# Patient Record
Sex: Female | Born: 1970 | Race: White | Hispanic: No | Marital: Married | Smoking: Never smoker
Health system: Southern US, Community
[De-identification: ages and names within clinical notes are randomized; demographics above are authoritative.]

## PROBLEM LIST (undated history)

## (undated) DIAGNOSIS — E271 Primary adrenocortical insufficiency: Secondary | ICD-10-CM

## (undated) DIAGNOSIS — K219 Gastro-esophageal reflux disease without esophagitis: Secondary | ICD-10-CM

## (undated) DIAGNOSIS — E039 Hypothyroidism, unspecified: Secondary | ICD-10-CM

## (undated) DIAGNOSIS — E079 Disorder of thyroid, unspecified: Secondary | ICD-10-CM

## (undated) HISTORY — PX: WISDOM TOOTH EXTRACTION: SHX21

## (undated) HISTORY — DX: Gastro-esophageal reflux disease without esophagitis: K21.9

---

## 1997-11-23 ENCOUNTER — Emergency Department (HOSPITAL_COMMUNITY): Admission: EM | Admit: 1997-11-23 | Discharge: 1997-11-23 | Payer: Self-pay | Admitting: Emergency Medicine

## 1998-05-07 ENCOUNTER — Other Ambulatory Visit: Admission: RE | Admit: 1998-05-07 | Discharge: 1998-05-07 | Payer: Self-pay | Admitting: Obstetrics and Gynecology

## 1999-03-30 ENCOUNTER — Other Ambulatory Visit: Admission: RE | Admit: 1999-03-30 | Discharge: 1999-03-30 | Payer: Self-pay | Admitting: Obstetrics and Gynecology

## 1999-10-09 ENCOUNTER — Encounter: Payer: Self-pay | Admitting: Obstetrics and Gynecology

## 1999-10-09 ENCOUNTER — Inpatient Hospital Stay (HOSPITAL_COMMUNITY): Admission: AD | Admit: 1999-10-09 | Discharge: 1999-10-09 | Payer: Self-pay | Admitting: Obstetrics and Gynecology

## 1999-10-22 ENCOUNTER — Inpatient Hospital Stay (HOSPITAL_COMMUNITY): Admission: AD | Admit: 1999-10-22 | Discharge: 1999-10-25 | Payer: Self-pay | Admitting: Obstetrics and Gynecology

## 1999-10-29 ENCOUNTER — Encounter: Admission: RE | Admit: 1999-10-29 | Discharge: 1999-12-01 | Payer: Self-pay | Admitting: Obstetrics and Gynecology

## 1999-11-19 ENCOUNTER — Other Ambulatory Visit: Admission: RE | Admit: 1999-11-19 | Discharge: 1999-11-19 | Payer: Self-pay | Admitting: Obstetrics and Gynecology

## 2000-06-02 ENCOUNTER — Other Ambulatory Visit: Admission: RE | Admit: 2000-06-02 | Discharge: 2000-06-02 | Payer: Self-pay | Admitting: Endocrinology

## 2000-07-13 ENCOUNTER — Encounter: Payer: Self-pay | Admitting: Obstetrics and Gynecology

## 2000-07-13 ENCOUNTER — Ambulatory Visit (HOSPITAL_COMMUNITY): Admission: RE | Admit: 2000-07-13 | Discharge: 2000-07-13 | Payer: Self-pay | Admitting: Obstetrics and Gynecology

## 2001-03-07 ENCOUNTER — Encounter: Admission: RE | Admit: 2001-03-07 | Discharge: 2001-03-07 | Payer: Self-pay | Admitting: Obstetrics and Gynecology

## 2001-03-07 ENCOUNTER — Encounter: Payer: Self-pay | Admitting: Obstetrics and Gynecology

## 2002-03-08 ENCOUNTER — Encounter: Payer: Self-pay | Admitting: Obstetrics and Gynecology

## 2002-03-08 ENCOUNTER — Encounter: Admission: RE | Admit: 2002-03-08 | Discharge: 2002-03-08 | Payer: Self-pay | Admitting: Obstetrics and Gynecology

## 2003-03-15 ENCOUNTER — Encounter: Admission: RE | Admit: 2003-03-15 | Discharge: 2003-03-15 | Payer: Self-pay | Admitting: Obstetrics and Gynecology

## 2004-03-10 ENCOUNTER — Other Ambulatory Visit: Admission: RE | Admit: 2004-03-10 | Discharge: 2004-03-10 | Payer: Self-pay | Admitting: Obstetrics and Gynecology

## 2004-04-22 ENCOUNTER — Encounter: Admission: RE | Admit: 2004-04-22 | Discharge: 2004-04-22 | Payer: Self-pay | Admitting: Obstetrics and Gynecology

## 2004-05-26 ENCOUNTER — Encounter: Admission: RE | Admit: 2004-05-26 | Discharge: 2004-05-26 | Payer: Self-pay | Admitting: Obstetrics and Gynecology

## 2004-09-01 ENCOUNTER — Ambulatory Visit: Payer: Self-pay | Admitting: Family Medicine

## 2005-04-08 ENCOUNTER — Other Ambulatory Visit: Admission: RE | Admit: 2005-04-08 | Discharge: 2005-04-08 | Payer: Self-pay | Admitting: Obstetrics and Gynecology

## 2005-07-14 ENCOUNTER — Ambulatory Visit: Payer: Self-pay | Admitting: Family Medicine

## 2005-11-09 ENCOUNTER — Encounter: Admission: RE | Admit: 2005-11-09 | Discharge: 2005-11-09 | Payer: Self-pay | Admitting: Obstetrics and Gynecology

## 2006-04-11 ENCOUNTER — Other Ambulatory Visit: Admission: RE | Admit: 2006-04-11 | Discharge: 2006-04-11 | Payer: Self-pay | Admitting: Obstetrics and Gynecology

## 2006-12-02 ENCOUNTER — Encounter: Admission: RE | Admit: 2006-12-02 | Discharge: 2006-12-02 | Payer: Self-pay | Admitting: Obstetrics and Gynecology

## 2007-04-11 ENCOUNTER — Other Ambulatory Visit: Admission: RE | Admit: 2007-04-11 | Discharge: 2007-04-11 | Payer: Self-pay | Admitting: Obstetrics and Gynecology

## 2007-11-02 ENCOUNTER — Encounter: Admission: RE | Admit: 2007-11-02 | Discharge: 2007-11-02 | Payer: Self-pay | Admitting: Obstetrics and Gynecology

## 2008-04-29 ENCOUNTER — Other Ambulatory Visit: Admission: RE | Admit: 2008-04-29 | Discharge: 2008-04-29 | Payer: Self-pay | Admitting: Obstetrics and Gynecology

## 2008-11-12 ENCOUNTER — Encounter: Admission: RE | Admit: 2008-11-12 | Discharge: 2008-11-12 | Payer: Self-pay | Admitting: Obstetrics and Gynecology

## 2008-12-30 ENCOUNTER — Encounter: Admission: RE | Admit: 2008-12-30 | Discharge: 2008-12-30 | Payer: Self-pay | Admitting: Obstetrics and Gynecology

## 2009-05-05 ENCOUNTER — Other Ambulatory Visit: Admission: RE | Admit: 2009-05-05 | Discharge: 2009-05-05 | Payer: Self-pay | Admitting: Obstetrics and Gynecology

## 2010-01-12 ENCOUNTER — Encounter: Admission: RE | Admit: 2010-01-12 | Discharge: 2010-01-12 | Payer: Self-pay | Admitting: Obstetrics and Gynecology

## 2010-01-12 ENCOUNTER — Other Ambulatory Visit: Admission: RE | Admit: 2010-01-12 | Discharge: 2010-01-12 | Payer: Self-pay | Admitting: Obstetrics and Gynecology

## 2010-02-23 ENCOUNTER — Encounter: Admission: RE | Admit: 2010-02-23 | Discharge: 2010-02-23 | Payer: Self-pay | Admitting: Orthopedic Surgery

## 2010-08-03 ENCOUNTER — Other Ambulatory Visit (HOSPITAL_COMMUNITY)
Admission: RE | Admit: 2010-08-03 | Discharge: 2010-08-03 | Disposition: A | Discharge status: Home / Self Care | Payer: 59 | Source: Ambulatory Visit | Attending: Obstetrics and Gynecology | Admitting: Obstetrics and Gynecology

## 2010-08-03 ENCOUNTER — Other Ambulatory Visit: Payer: Self-pay | Admitting: Obstetrics and Gynecology

## 2010-08-03 DIAGNOSIS — Z01419 Encounter for gynecological examination (general) (routine) without abnormal findings: Secondary | ICD-10-CM | POA: Insufficient documentation

## 2011-02-02 ENCOUNTER — Other Ambulatory Visit: Payer: Self-pay | Admitting: Obstetrics and Gynecology

## 2011-02-02 DIAGNOSIS — Z1231 Encounter for screening mammogram for malignant neoplasm of breast: Secondary | ICD-10-CM

## 2011-02-22 ENCOUNTER — Ambulatory Visit
Admission: RE | Admit: 2011-02-22 | Discharge: 2011-02-22 | Disposition: A | Discharge status: Home / Self Care | Payer: 59 | Source: Ambulatory Visit | Attending: Obstetrics and Gynecology | Admitting: Obstetrics and Gynecology

## 2011-02-22 DIAGNOSIS — Z1231 Encounter for screening mammogram for malignant neoplasm of breast: Secondary | ICD-10-CM

## 2011-08-09 ENCOUNTER — Other Ambulatory Visit: Payer: Self-pay | Admitting: Obstetrics and Gynecology

## 2011-08-09 ENCOUNTER — Other Ambulatory Visit (HOSPITAL_COMMUNITY)
Admission: RE | Admit: 2011-08-09 | Discharge: 2011-08-09 | Disposition: A | Discharge status: Home / Self Care | Payer: 59 | Source: Ambulatory Visit | Attending: Obstetrics and Gynecology | Admitting: Obstetrics and Gynecology

## 2011-08-09 DIAGNOSIS — Z01419 Encounter for gynecological examination (general) (routine) without abnormal findings: Secondary | ICD-10-CM | POA: Insufficient documentation

## 2012-02-16 ENCOUNTER — Other Ambulatory Visit: Payer: Self-pay | Admitting: Obstetrics and Gynecology

## 2012-02-16 DIAGNOSIS — Z1231 Encounter for screening mammogram for malignant neoplasm of breast: Secondary | ICD-10-CM

## 2012-03-23 ENCOUNTER — Ambulatory Visit
Admission: RE | Admit: 2012-03-23 | Discharge: 2012-03-23 | Disposition: A | Discharge status: Home / Self Care | Payer: 59 | Source: Ambulatory Visit | Attending: Obstetrics and Gynecology | Admitting: Obstetrics and Gynecology

## 2012-03-23 DIAGNOSIS — Z1231 Encounter for screening mammogram for malignant neoplasm of breast: Secondary | ICD-10-CM

## 2012-08-14 ENCOUNTER — Other Ambulatory Visit: Payer: Self-pay | Admitting: Obstetrics and Gynecology

## 2012-08-14 ENCOUNTER — Other Ambulatory Visit (HOSPITAL_COMMUNITY)
Admission: RE | Admit: 2012-08-14 | Discharge: 2012-08-14 | Disposition: A | Discharge status: Home / Self Care | Payer: 59 | Source: Ambulatory Visit | Attending: Obstetrics and Gynecology | Admitting: Obstetrics and Gynecology

## 2012-08-14 DIAGNOSIS — Z1151 Encounter for screening for human papillomavirus (HPV): Secondary | ICD-10-CM | POA: Insufficient documentation

## 2012-08-14 DIAGNOSIS — Z01419 Encounter for gynecological examination (general) (routine) without abnormal findings: Secondary | ICD-10-CM | POA: Insufficient documentation

## 2013-04-03 ENCOUNTER — Other Ambulatory Visit: Payer: Self-pay

## 2013-04-03 DIAGNOSIS — Z1231 Encounter for screening mammogram for malignant neoplasm of breast: Secondary | ICD-10-CM

## 2013-04-23 ENCOUNTER — Ambulatory Visit
Admission: RE | Admit: 2013-04-23 | Discharge: 2013-04-23 | Disposition: A | Discharge status: Home / Self Care | Payer: 59 | Source: Ambulatory Visit

## 2013-04-23 DIAGNOSIS — Z1231 Encounter for screening mammogram for malignant neoplasm of breast: Secondary | ICD-10-CM

## 2013-08-27 ENCOUNTER — Other Ambulatory Visit: Payer: Self-pay | Admitting: Obstetrics and Gynecology

## 2013-08-27 ENCOUNTER — Other Ambulatory Visit (HOSPITAL_COMMUNITY)
Admission: RE | Admit: 2013-08-27 | Discharge: 2013-08-27 | Disposition: A | Discharge status: Home / Self Care | Payer: 59 | Source: Ambulatory Visit | Attending: Obstetrics and Gynecology | Admitting: Obstetrics and Gynecology

## 2013-08-27 DIAGNOSIS — Z01419 Encounter for gynecological examination (general) (routine) without abnormal findings: Secondary | ICD-10-CM | POA: Insufficient documentation

## 2014-04-12 ENCOUNTER — Other Ambulatory Visit: Payer: Self-pay

## 2014-04-12 DIAGNOSIS — Z1231 Encounter for screening mammogram for malignant neoplasm of breast: Secondary | ICD-10-CM

## 2014-04-29 ENCOUNTER — Ambulatory Visit
Admission: RE | Admit: 2014-04-29 | Discharge: 2014-04-29 | Disposition: A | Discharge status: Home / Self Care | Payer: 59 | Source: Ambulatory Visit

## 2014-04-29 DIAGNOSIS — Z1231 Encounter for screening mammogram for malignant neoplasm of breast: Secondary | ICD-10-CM

## 2014-09-02 ENCOUNTER — Other Ambulatory Visit: Payer: Self-pay | Admitting: Obstetrics and Gynecology

## 2014-09-02 ENCOUNTER — Other Ambulatory Visit (HOSPITAL_COMMUNITY)
Admission: RE | Admit: 2014-09-02 | Discharge: 2014-09-02 | Disposition: A | Discharge status: Home / Self Care | Payer: 59 | Source: Ambulatory Visit | Attending: Obstetrics and Gynecology | Admitting: Obstetrics and Gynecology

## 2014-09-02 DIAGNOSIS — Z01419 Encounter for gynecological examination (general) (routine) without abnormal findings: Secondary | ICD-10-CM | POA: Diagnosis not present

## 2014-09-03 LAB — CYTOLOGY - PAP

## 2015-04-21 ENCOUNTER — Other Ambulatory Visit: Payer: Self-pay

## 2015-04-21 DIAGNOSIS — Z1231 Encounter for screening mammogram for malignant neoplasm of breast: Secondary | ICD-10-CM

## 2015-05-26 ENCOUNTER — Ambulatory Visit
Admission: RE | Admit: 2015-05-26 | Discharge: 2015-05-26 | Disposition: A | Discharge status: Home / Self Care | Payer: 59 | Source: Ambulatory Visit

## 2015-05-26 DIAGNOSIS — Z1231 Encounter for screening mammogram for malignant neoplasm of breast: Secondary | ICD-10-CM

## 2015-09-01 ENCOUNTER — Other Ambulatory Visit: Payer: Self-pay | Admitting: Obstetrics and Gynecology

## 2015-09-01 ENCOUNTER — Other Ambulatory Visit (HOSPITAL_COMMUNITY)
Admission: RE | Admit: 2015-09-01 | Discharge: 2015-09-01 | Disposition: A | Discharge status: Home / Self Care | Payer: 59 | Source: Ambulatory Visit | Attending: Obstetrics and Gynecology | Admitting: Obstetrics and Gynecology

## 2015-09-01 DIAGNOSIS — Z01419 Encounter for gynecological examination (general) (routine) without abnormal findings: Secondary | ICD-10-CM | POA: Insufficient documentation

## 2015-09-01 DIAGNOSIS — Z1151 Encounter for screening for human papillomavirus (HPV): Secondary | ICD-10-CM | POA: Diagnosis present

## 2015-09-05 LAB — CYTOLOGY - PAP

## 2016-04-21 DIAGNOSIS — Z0000 Encounter for general adult medical examination without abnormal findings: Secondary | ICD-10-CM | POA: Diagnosis not present

## 2016-04-28 DIAGNOSIS — Z1389 Encounter for screening for other disorder: Secondary | ICD-10-CM | POA: Diagnosis not present

## 2016-04-28 DIAGNOSIS — D751 Secondary polycythemia: Secondary | ICD-10-CM | POA: Diagnosis not present

## 2016-04-28 DIAGNOSIS — E041 Nontoxic single thyroid nodule: Secondary | ICD-10-CM | POA: Diagnosis not present

## 2016-04-28 DIAGNOSIS — E271 Primary adrenocortical insufficiency: Secondary | ICD-10-CM | POA: Diagnosis not present

## 2016-04-28 DIAGNOSIS — Z0000 Encounter for general adult medical examination without abnormal findings: Secondary | ICD-10-CM | POA: Diagnosis not present

## 2016-05-14 DIAGNOSIS — M859 Disorder of bone density and structure, unspecified: Secondary | ICD-10-CM | POA: Diagnosis not present

## 2016-05-24 ENCOUNTER — Other Ambulatory Visit: Payer: Self-pay | Admitting: Obstetrics and Gynecology

## 2016-05-24 DIAGNOSIS — Z1231 Encounter for screening mammogram for malignant neoplasm of breast: Secondary | ICD-10-CM

## 2016-06-11 ENCOUNTER — Ambulatory Visit
Admission: RE | Admit: 2016-06-11 | Discharge: 2016-06-11 | Disposition: A | Discharge status: Home / Self Care | Payer: 59 | Source: Ambulatory Visit | Attending: Obstetrics and Gynecology | Admitting: Obstetrics and Gynecology

## 2016-06-11 DIAGNOSIS — Z1231 Encounter for screening mammogram for malignant neoplasm of breast: Secondary | ICD-10-CM | POA: Diagnosis not present

## 2016-06-23 ENCOUNTER — Emergency Department (HOSPITAL_COMMUNITY)
Admission: EM | Admit: 2016-06-23 | Discharge: 2016-06-23 | Disposition: A | Discharge status: Home / Self Care | Payer: 59 | Attending: Emergency Medicine | Admitting: Emergency Medicine

## 2016-06-23 ENCOUNTER — Encounter (HOSPITAL_COMMUNITY): Payer: Self-pay | Admitting: *Deleted

## 2016-06-23 ENCOUNTER — Emergency Department (HOSPITAL_COMMUNITY): Payer: 59

## 2016-06-23 DIAGNOSIS — R05 Cough: Secondary | ICD-10-CM | POA: Diagnosis not present

## 2016-06-23 DIAGNOSIS — J111 Influenza due to unidentified influenza virus with other respiratory manifestations: Secondary | ICD-10-CM | POA: Diagnosis not present

## 2016-06-23 HISTORY — DX: Disorder of thyroid, unspecified: E07.9

## 2016-06-23 HISTORY — DX: Primary adrenocortical insufficiency: E27.1

## 2016-06-23 LAB — CBC WITH DIFFERENTIAL/PLATELET
BASOS ABS: 0 10*3/uL (ref 0.0–0.1)
Basophils Relative: 0 %
Eosinophils Absolute: 0.1 10*3/uL (ref 0.0–0.7)
Eosinophils Relative: 1 %
HEMATOCRIT: 43.5 % (ref 36.0–46.0)
Hemoglobin: 15.3 g/dL — ABNORMAL HIGH (ref 12.0–15.0)
LYMPHS PCT: 30 %
Lymphs Abs: 2 10*3/uL (ref 0.7–4.0)
MCH: 31.4 pg (ref 26.0–34.0)
MCHC: 35.2 g/dL (ref 30.0–36.0)
MCV: 89.1 fL (ref 78.0–100.0)
MONOS PCT: 13 %
Monocytes Absolute: 0.9 10*3/uL (ref 0.1–1.0)
NEUTROS ABS: 3.8 10*3/uL (ref 1.7–7.7)
Neutrophils Relative %: 56 %
PLATELETS: 172 10*3/uL (ref 150–400)
RBC: 4.88 MIL/uL (ref 3.87–5.11)
RDW: 12.9 % (ref 11.5–15.5)
WBC: 6.7 10*3/uL (ref 4.0–10.5)

## 2016-06-23 LAB — BASIC METABOLIC PANEL
ANION GAP: 12 (ref 5–15)
BUN: 10 mg/dL (ref 6–20)
CALCIUM: 8.6 mg/dL — AB (ref 8.9–10.3)
CO2: 19 mmol/L — AB (ref 22–32)
CREATININE: 0.92 mg/dL (ref 0.44–1.00)
Chloride: 99 mmol/L — ABNORMAL LOW (ref 101–111)
GFR calc Af Amer: >60 mL/min (ref >60)
GLUCOSE: 86 mg/dL (ref 65–99)
Potassium: 3.3 mmol/L — ABNORMAL LOW (ref 3.5–5.1)
Sodium: 130 mmol/L — ABNORMAL LOW (ref 135–145)

## 2016-06-23 LAB — INFLUENZA PANEL BY PCR (TYPE A & B)
Influenza A By PCR: NEGATIVE
Influenza B By PCR: POSITIVE — AB

## 2016-06-23 MED ORDER — OSELTAMIVIR PHOSPHATE 75 MG PO CAPS: 75.0000 mg | ORAL_CAPSULE | Freq: Two times a day (BID) | ORAL | 0 refills | Status: DC

## 2016-06-23 MED ORDER — FLUDROCORTISONE ACETATE 0.1 MG PO TABS
0.1000 mg | ORAL_TABLET | Freq: Every day | ORAL | Status: DC
  Administered 2016-06-23: 0.1 mg via ORAL
  Filled 2016-06-23: qty 1

## 2016-06-23 MED ORDER — SODIUM CHLORIDE 0.9 % IV BOLUS (SEPSIS)
1000.0000 mL | Freq: Once | INTRAVENOUS | Status: AC
  Administered 2016-06-23: 1000 mL via INTRAVENOUS

## 2016-06-23 MED ORDER — HYDROCORTISONE 20 MG PO TABS
20.0000 mg | ORAL_TABLET | Freq: Every day | ORAL | Status: DC
  Administered 2016-06-23: 20 mg via ORAL
  Filled 2016-06-23: qty 1

## 2016-06-23 MED ORDER — LEVOTHYROXINE SODIUM 75 MCG PO TABS
75.0000 ug | ORAL_TABLET | Freq: Once | ORAL | Status: AC
  Administered 2016-06-23: 75 ug via ORAL
  Filled 2016-06-23: qty 1

## 2016-06-23 NOTE — ED Notes (Signed)
Pt being taken to Xray

## 2016-06-23 NOTE — ED Notes (Signed)
PT returned from Xray 

## 2016-06-23 NOTE — ED Notes (Signed)
MD Zammit at the bedside.  

## 2016-06-23 NOTE — ED Notes (Signed)
Pt verbalizes understanding of discharge instructions. NAD. A/ox4.

## 2016-06-23 NOTE — Discharge Instructions (Signed)
Rest drink plenty of fluids take Motrin or Tylenol for fever and aches follow-up with any problems

## 2016-06-23 NOTE — ED Triage Notes (Signed)
Pt reports flu like symptoms since Sunday night. Has fever, bodyaches, headache, sore throat, chills.

## 2016-06-23 NOTE — ED Provider Notes (Signed)
[facility] DEPT Provider Note   CSN: [id number] Arrival date & time: 06/23/16  0903     History   Chief Complaint Chief Complaint  Patient presents with  . Influenza    HPI Mary Dunn is a 46 y.o. female.   Patient complains of cough fever aches for a couple days.   The history is provided by the patient.  Influenza  Presenting symptoms: cough   Presenting symptoms: no diarrhea, no fatigue and no headaches   Severity:  Moderate Onset quality:  Sudden Progression:  Waxing and waning Chronicity:  New Relieved by:  Nothing Associated symptoms: no congestion     Past Medical History:  Diagnosis Date  . Addison's disease ([place])   . Thyroid disease     There are no active problems to display for this patient.   History reviewed. No pertinent surgical history.  OB History    No data available       Home Medications    Prior to Admission medications   Medication Sig Start Date End Date Taking? Authorizing Provider  fludrocortisone (FLORINEF) 0.1 MG tablet Take 100 mcg by mouth daily. as directed 05/24/16  Yes Historical Provider, MD  hydrocortisone (CORTEF) 5 MG tablet Take 5-20 mg by mouth daily. 20 mg in the morning. 5 mg at bedtime   Yes Historical Provider, MD  ibuprofen (ADVIL,MOTRIN) 200 MG tablet Take 200 mg by mouth every 6 (six) hours as needed for mild pain.   Yes Historical Provider, MD  levothyroxine (SYNTHROID, LEVOTHROID) 75 MCG tablet Take 75 mcg by mouth daily. 05/24/16  Yes Historical Provider, MD  oseltamivir (TAMIFLU) 75 MG capsule Take 1 capsule (75 mg total) by mouth every 12 (twelve) hours. 06/23/16   Milton Ferguson, MD    Family History History reviewed. No pertinent family history.  Social History Social History  Substance Use Topics  . Smoking status: Never Smoker  . Smokeless tobacco: Not on file  . Alcohol use Yes     Comment: occ     Allergies   Patient has no known allergies.   Review of Systems Review of Systems   Constitutional: Negative for appetite change and fatigue.  HENT: Negative for congestion, ear discharge and sinus pressure.   Eyes: Negative for discharge.  Respiratory: Positive for cough.   Cardiovascular: Negative for chest pain.  Gastrointestinal: Negative for abdominal pain and diarrhea.  Genitourinary: Negative for frequency and hematuria.  Musculoskeletal: Negative for back pain.  Skin: Negative for rash.  Neurological: Negative for seizures and headaches.  Psychiatric/Behavioral: Negative for hallucinations.     Physical Exam Updated Vital Signs BP 96/61   Pulse 92   Temp 99.7 F (37.6 C) (Oral)   Resp 20   Ht 5\' 5"  (1.651 m)   Wt 118 lb (53.5 kg)   LMP 06/02/2016   SpO2 98%   BMI 19.64 kg/m   Physical Exam  Constitutional: She is oriented to person, place, and time. She appears well-developed.  HENT:  Head: Normocephalic.  Eyes: Conjunctivae and EOM are normal. No scleral icterus.  Neck: Neck supple. No thyromegaly present.  Cardiovascular: Normal rate and regular rhythm.  Exam reveals no gallop and no friction rub.   No murmur heard. Pulmonary/Chest: No stridor. She has no wheezes. She has no rales. She exhibits no tenderness.  Abdominal: She exhibits no distension. There is no tenderness. There is no rebound.  Musculoskeletal: Normal range of motion. She exhibits no edema.  Lymphadenopathy:    She has  no cervical adenopathy.  Neurological: She is oriented to person, place, and time. She exhibits normal muscle tone. Coordination normal.  Skin: No rash noted. No erythema.  Psychiatric: She has a normal mood and affect. Her behavior is normal.     ED Treatments / Results  Labs (all labs ordered are listed, but only abnormal results are displayed) Labs Reviewed  CBC WITH DIFFERENTIAL/PLATELET - Abnormal; Notable for the following:       Result Value   Hemoglobin 15.3 (*)    All other components within normal limits  BASIC METABOLIC PANEL - Abnormal;  Notable for the following:    Sodium 130 (*)    Potassium 3.3 (*)    Chloride 99 (*)    CO2 19 (*)    Calcium 8.6 (*)    All other components within normal limits  INFLUENZA PANEL BY PCR (TYPE A & B) - Abnormal; Notable for the following:    Influenza B By PCR POSITIVE (*)    All other components within normal limits    EKG  EKG Interpretation None       Radiology Dg Chest 2 View  Result Date: 06/23/2016 CLINICAL DATA:  Chest pressure and cough over the last 3 days. EXAM: CHEST  2 VIEW COMPARISON:  None. FINDINGS: Heart size is normal. Mediastinal shadows are normal. The lungs are clear. No bronchial thickening. No infiltrate, mass, effusion or collapse. Pulmonary vascularity is normal. No bony abnormality. Normal chest IMPRESSION: No active cardiopulmonary disease. Electronically Signed   By: Nelson Chimes M.D.   On: 06/23/2016 11:16    Procedures Procedures (including critical care time)  Medications Ordered in ED Medications  hydrocortisone (CORTEF) tablet 20 mg (20 mg Oral Given 06/23/16 1048)  fludrocortisone (FLORINEF) tablet 0.1 mg (0.1 mg Oral Given 06/23/16 1048)  sodium chloride 0.9 % bolus 1,000 mL (0 mLs Intravenous Stopped 06/23/16 1152)  levothyroxine (SYNTHROID, LEVOTHROID) tablet 75 mcg (75 mcg Oral Given 06/23/16 1047)     Initial Impression / Assessment and Plan / ED Course  I have reviewed the triage vital signs and the nursing notes.  Pertinent labs & imaging results that were available during my care of the patient were reviewed by me and considered in my medical decision making (see chart for details).     Patient with influenza.      Will treat patient with Tamiflu rest Motrin and Tylenol  Final Clinical Impressions(s) / ED Diagnoses   Final diagnoses:  Influenza    New Prescriptions New Prescriptions   OSELTAMIVIR (TAMIFLU) 75 MG CAPSULE    Take 1 capsule (75 mg total) by mouth every 12 (twelve) hours.     Milton Ferguson, MD 06/23/16  [phone]

## 2016-10-26 DIAGNOSIS — Z1389 Encounter for screening for other disorder: Secondary | ICD-10-CM | POA: Diagnosis not present

## 2016-10-26 DIAGNOSIS — E041 Nontoxic single thyroid nodule: Secondary | ICD-10-CM | POA: Diagnosis not present

## 2016-10-26 DIAGNOSIS — E271 Primary adrenocortical insufficiency: Secondary | ICD-10-CM | POA: Diagnosis not present

## 2016-10-26 DIAGNOSIS — E310 Autoimmune polyglandular failure: Secondary | ICD-10-CM | POA: Diagnosis not present

## 2016-11-12 DIAGNOSIS — Z01411 Encounter for gynecological examination (general) (routine) with abnormal findings: Secondary | ICD-10-CM | POA: Diagnosis not present

## 2016-12-28 DIAGNOSIS — N921 Excessive and frequent menstruation with irregular cycle: Secondary | ICD-10-CM | POA: Diagnosis not present

## 2017-01-08 DIAGNOSIS — Z23 Encounter for immunization: Secondary | ICD-10-CM | POA: Diagnosis not present

## 2017-01-21 ENCOUNTER — Other Ambulatory Visit: Payer: Self-pay | Admitting: Obstetrics and Gynecology

## 2017-01-21 DIAGNOSIS — N921 Excessive and frequent menstruation with irregular cycle: Secondary | ICD-10-CM | POA: Diagnosis not present

## 2017-05-03 DIAGNOSIS — M859 Disorder of bone density and structure, unspecified: Secondary | ICD-10-CM | POA: Diagnosis not present

## 2017-05-03 DIAGNOSIS — R82998 Other abnormal findings in urine: Secondary | ICD-10-CM | POA: Diagnosis not present

## 2017-05-03 DIAGNOSIS — E041 Nontoxic single thyroid nodule: Secondary | ICD-10-CM | POA: Diagnosis not present

## 2017-05-03 DIAGNOSIS — Z0000 Encounter for general adult medical examination without abnormal findings: Secondary | ICD-10-CM | POA: Diagnosis not present

## 2017-05-10 DIAGNOSIS — E038 Other specified hypothyroidism: Secondary | ICD-10-CM | POA: Diagnosis not present

## 2017-05-10 DIAGNOSIS — E310 Autoimmune polyglandular failure: Secondary | ICD-10-CM | POA: Diagnosis not present

## 2017-05-10 DIAGNOSIS — E271 Primary adrenocortical insufficiency: Secondary | ICD-10-CM | POA: Diagnosis not present

## 2017-05-10 DIAGNOSIS — Z0000 Encounter for general adult medical examination without abnormal findings: Secondary | ICD-10-CM | POA: Diagnosis not present

## 2017-05-12 DIAGNOSIS — Z1212 Encounter for screening for malignant neoplasm of rectum: Secondary | ICD-10-CM | POA: Diagnosis not present

## 2017-06-01 ENCOUNTER — Other Ambulatory Visit: Payer: Self-pay | Admitting: Obstetrics and Gynecology

## 2017-06-01 DIAGNOSIS — Z1231 Encounter for screening mammogram for malignant neoplasm of breast: Secondary | ICD-10-CM

## 2017-06-24 ENCOUNTER — Ambulatory Visit
Admission: RE | Admit: 2017-06-24 | Discharge: 2017-06-24 | Disposition: A | Discharge status: Home / Self Care | Payer: 59 | Source: Ambulatory Visit | Attending: Obstetrics and Gynecology | Admitting: Obstetrics and Gynecology

## 2017-06-24 DIAGNOSIS — Z1231 Encounter for screening mammogram for malignant neoplasm of breast: Secondary | ICD-10-CM | POA: Diagnosis not present

## 2017-11-09 DIAGNOSIS — M8580 Other specified disorders of bone density and structure, unspecified site: Secondary | ICD-10-CM | POA: Diagnosis not present

## 2017-11-09 DIAGNOSIS — E038 Other specified hypothyroidism: Secondary | ICD-10-CM | POA: Diagnosis not present

## 2017-11-09 DIAGNOSIS — E271 Primary adrenocortical insufficiency: Secondary | ICD-10-CM | POA: Diagnosis not present

## 2017-11-18 DIAGNOSIS — Z01419 Encounter for gynecological examination (general) (routine) without abnormal findings: Secondary | ICD-10-CM | POA: Diagnosis not present

## 2017-12-02 DIAGNOSIS — Z23 Encounter for immunization: Secondary | ICD-10-CM | POA: Diagnosis not present

## 2018-04-11 DIAGNOSIS — N939 Abnormal uterine and vaginal bleeding, unspecified: Secondary | ICD-10-CM | POA: Diagnosis not present

## 2018-04-25 DIAGNOSIS — N9489 Other specified conditions associated with female genital organs and menstrual cycle: Secondary | ICD-10-CM | POA: Diagnosis not present

## 2018-04-25 DIAGNOSIS — N939 Abnormal uterine and vaginal bleeding, unspecified: Secondary | ICD-10-CM | POA: Diagnosis not present

## 2018-04-25 DIAGNOSIS — Z3202 Encounter for pregnancy test, result negative: Secondary | ICD-10-CM | POA: Diagnosis not present

## 2018-05-09 ENCOUNTER — Other Ambulatory Visit: Payer: Self-pay

## 2018-05-09 ENCOUNTER — Encounter (HOSPITAL_BASED_OUTPATIENT_CLINIC_OR_DEPARTMENT_OTHER): Payer: Self-pay | Admitting: *Deleted

## 2018-05-10 DIAGNOSIS — Z0000 Encounter for general adult medical examination without abnormal findings: Secondary | ICD-10-CM | POA: Diagnosis not present

## 2018-05-10 DIAGNOSIS — R82998 Other abnormal findings in urine: Secondary | ICD-10-CM | POA: Diagnosis not present

## 2018-05-10 DIAGNOSIS — E038 Other specified hypothyroidism: Secondary | ICD-10-CM | POA: Diagnosis not present

## 2018-05-12 DIAGNOSIS — N921 Excessive and frequent menstruation with irregular cycle: Secondary | ICD-10-CM | POA: Diagnosis not present

## 2018-05-12 DIAGNOSIS — N946 Dysmenorrhea, unspecified: Secondary | ICD-10-CM | POA: Diagnosis not present

## 2018-05-12 DIAGNOSIS — N9489 Other specified conditions associated with female genital organs and menstrual cycle: Secondary | ICD-10-CM | POA: Diagnosis not present

## 2018-05-14 ENCOUNTER — Other Ambulatory Visit: Payer: Self-pay | Admitting: Obstetrics and Gynecology

## 2018-05-15 ENCOUNTER — Encounter (HOSPITAL_BASED_OUTPATIENT_CLINIC_OR_DEPARTMENT_OTHER)
Admission: RE | Admit: 2018-05-15 | Discharge: 2018-05-15 | Disposition: A | Discharge status: Home / Self Care | Payer: 59 | Source: Ambulatory Visit | Attending: Obstetrics and Gynecology | Admitting: Obstetrics and Gynecology

## 2018-05-15 DIAGNOSIS — K219 Gastro-esophageal reflux disease without esophagitis: Secondary | ICD-10-CM | POA: Diagnosis not present

## 2018-05-15 DIAGNOSIS — Z01812 Encounter for preprocedural laboratory examination: Secondary | ICD-10-CM

## 2018-05-15 DIAGNOSIS — N840 Polyp of corpus uteri: Secondary | ICD-10-CM | POA: Diagnosis not present

## 2018-05-15 DIAGNOSIS — Z79899 Other long term (current) drug therapy: Secondary | ICD-10-CM | POA: Diagnosis not present

## 2018-05-15 DIAGNOSIS — E039 Hypothyroidism, unspecified: Secondary | ICD-10-CM | POA: Diagnosis not present

## 2018-05-15 DIAGNOSIS — E271 Primary adrenocortical insufficiency: Secondary | ICD-10-CM | POA: Diagnosis not present

## 2018-05-15 DIAGNOSIS — N939 Abnormal uterine and vaginal bleeding, unspecified: Secondary | ICD-10-CM | POA: Diagnosis not present

## 2018-05-15 DIAGNOSIS — Z79890 Hormone replacement therapy (postmenopausal): Secondary | ICD-10-CM | POA: Diagnosis not present

## 2018-05-15 LAB — BASIC METABOLIC PANEL
Anion gap: 14 (ref 5–15)
BUN: 12 mg/dL (ref 6–20)
CO2: 24 mmol/L (ref 22–32)
Calcium: 9.3 mg/dL (ref 8.9–10.3)
Chloride: 97 mmol/L — ABNORMAL LOW (ref 98–111)
Creatinine, Ser: 0.92 mg/dL (ref 0.44–1.00)
GFR calc Af Amer: >60 mL/min (ref >60)
GFR calc non Af Amer: >60 mL/min (ref >60)
Glucose, Bld: 133 mg/dL — ABNORMAL HIGH (ref 70–99)
Potassium: 4.5 mmol/L (ref 3.5–5.1)
Sodium: 135 mmol/L (ref 135–145)

## 2018-05-15 LAB — CBC
HCT: 47.5 % — ABNORMAL HIGH (ref 36.0–46.0)
HEMOGLOBIN: 14.8 g/dL (ref 12.0–15.0)
MCH: 25 pg — ABNORMAL LOW (ref 26.0–34.0)
MCHC: 31.2 g/dL (ref 30.0–36.0)
MCV: 80.4 fL (ref 80.0–100.0)
Platelets: 281 10*3/uL (ref 150–400)
RBC: 5.91 MIL/uL — AB (ref 3.87–5.11)
RDW: 25 % — ABNORMAL HIGH (ref 11.5–15.5)
WBC: 7.8 10*3/uL (ref 4.0–10.5)
nRBC: 0 % (ref 0.0–0.2)

## 2018-05-15 LAB — POCT PREGNANCY, URINE: Preg Test, Ur: NEGATIVE

## 2018-05-15 NOTE — Progress Notes (Signed)
Okay'd lab results with Dr. Lanetta Inch.  Okay to proceed with surgery as scheduled.

## 2018-05-15 NOTE — Progress Notes (Signed)
Pregnancy test negative

## 2018-05-16 ENCOUNTER — Other Ambulatory Visit: Payer: Self-pay

## 2018-05-16 ENCOUNTER — Ambulatory Visit (HOSPITAL_BASED_OUTPATIENT_CLINIC_OR_DEPARTMENT_OTHER): Payer: 59 | Admitting: Certified Registered"

## 2018-05-16 ENCOUNTER — Ambulatory Visit (HOSPITAL_BASED_OUTPATIENT_CLINIC_OR_DEPARTMENT_OTHER)
Admission: RE | Admit: 2018-05-16 | Discharge: 2018-05-16 | Disposition: A | Discharge status: Home / Self Care | Payer: 59 | Attending: Obstetrics and Gynecology | Admitting: Obstetrics and Gynecology

## 2018-05-16 ENCOUNTER — Encounter (HOSPITAL_BASED_OUTPATIENT_CLINIC_OR_DEPARTMENT_OTHER)
Admission: RE | Disposition: A | Discharge status: Home / Self Care | Payer: Self-pay | Source: Home / Self Care | Attending: Obstetrics and Gynecology

## 2018-05-16 ENCOUNTER — Encounter (HOSPITAL_BASED_OUTPATIENT_CLINIC_OR_DEPARTMENT_OTHER): Payer: Self-pay

## 2018-05-16 DIAGNOSIS — N840 Polyp of corpus uteri: Secondary | ICD-10-CM | POA: Diagnosis not present

## 2018-05-16 DIAGNOSIS — E039 Hypothyroidism, unspecified: Secondary | ICD-10-CM | POA: Insufficient documentation

## 2018-05-16 DIAGNOSIS — N858 Other specified noninflammatory disorders of uterus: Secondary | ICD-10-CM | POA: Diagnosis not present

## 2018-05-16 DIAGNOSIS — E271 Primary adrenocortical insufficiency: Secondary | ICD-10-CM | POA: Insufficient documentation

## 2018-05-16 DIAGNOSIS — N9489 Other specified conditions associated with female genital organs and menstrual cycle: Secondary | ICD-10-CM | POA: Diagnosis not present

## 2018-05-16 DIAGNOSIS — K219 Gastro-esophageal reflux disease without esophagitis: Secondary | ICD-10-CM | POA: Insufficient documentation

## 2018-05-16 DIAGNOSIS — Z79899 Other long term (current) drug therapy: Secondary | ICD-10-CM | POA: Insufficient documentation

## 2018-05-16 DIAGNOSIS — N939 Abnormal uterine and vaginal bleeding, unspecified: Secondary | ICD-10-CM | POA: Diagnosis not present

## 2018-05-16 DIAGNOSIS — Z79890 Hormone replacement therapy (postmenopausal): Secondary | ICD-10-CM | POA: Insufficient documentation

## 2018-05-16 HISTORY — DX: Hypothyroidism, unspecified: E03.9

## 2018-05-16 HISTORY — PX: DILATATION & CURETTAGE/HYSTEROSCOPY WITH MYOSURE: SHX6511

## 2018-05-16 SURGERY — DILATATION & CURETTAGE/HYSTEROSCOPY WITH MYOSURE
Anesthesia: General | Site: Uterus

## 2018-05-16 MED ORDER — LIDOCAINE 2% (20 MG/ML) 5 ML SYRINGE
INTRAMUSCULAR | Status: DC | PRN
  Administered 2018-05-16: 60 mg via INTRAVENOUS

## 2018-05-16 MED ORDER — FENTANYL CITRATE (PF) 100 MCG/2ML IJ SOLN: 25.0000 ug | INTRAMUSCULAR | Status: DC | PRN

## 2018-05-16 MED ORDER — SILVER NITRATE-POT NITRATE 75-25 % EX MISC
CUTANEOUS | Status: DC | PRN
  Administered 2018-05-16: 1

## 2018-05-16 MED ORDER — ONDANSETRON HCL 4 MG/2ML IJ SOLN
INTRAMUSCULAR | Status: DC | PRN
  Administered 2018-05-16: 4 mg via INTRAVENOUS

## 2018-05-16 MED ORDER — ACETAMINOPHEN 500 MG PO TABS
1000.0000 mg | ORAL_TABLET | ORAL | Status: AC
  Administered 2018-05-16: 1000 mg via ORAL

## 2018-05-16 MED ORDER — MIDAZOLAM HCL 2 MG/2ML IJ SOLN
INTRAMUSCULAR | Status: AC
  Filled 2018-05-16: qty 2

## 2018-05-16 MED ORDER — LACTATED RINGERS IV SOLN
INTRAVENOUS | Status: DC
  Administered 2018-05-16: 12:00:00 via INTRAVENOUS

## 2018-05-16 MED ORDER — OXYCODONE HCL 5 MG/5ML PO SOLN: 5.0000 mg | Freq: Once | ORAL | Status: DC | PRN

## 2018-05-16 MED ORDER — ACETAMINOPHEN 10 MG/ML IV SOLN: 1000.0000 mg | Freq: Once | INTRAVENOUS | Status: DC | PRN

## 2018-05-16 MED ORDER — BUPIVACAINE HCL (PF) 0.25 % IJ SOLN
INTRAMUSCULAR | Status: DC | PRN
  Administered 2018-05-16: 20 mL

## 2018-05-16 MED ORDER — ACETAMINOPHEN 500 MG PO TABS
ORAL_TABLET | ORAL | Status: AC
  Filled 2018-05-16: qty 2

## 2018-05-16 MED ORDER — MIDAZOLAM HCL 2 MG/2ML IJ SOLN
1.0000 mg | INTRAMUSCULAR | Status: DC | PRN
  Administered 2018-05-16: 2 mg via INTRAVENOUS

## 2018-05-16 MED ORDER — LACTATED RINGERS IV SOLN
INTRAVENOUS | Status: DC
  Administered 2018-05-16: 11:00:00 via INTRAVENOUS

## 2018-05-16 MED ORDER — SODIUM CHLORIDE 0.9 % IR SOLN
Status: DC | PRN
  Administered 2018-05-16: 3000 mL

## 2018-05-16 MED ORDER — DOXYCYCLINE HYCLATE 50 MG PO CAPS: 100.0000 mg | ORAL_CAPSULE | Freq: Every day | ORAL | 0 refills | Status: AC

## 2018-05-16 MED ORDER — PROPOFOL 10 MG/ML IV BOLUS
INTRAVENOUS | Status: DC | PRN
  Administered 2018-05-16: 130 mg via INTRAVENOUS

## 2018-05-16 MED ORDER — IBUPROFEN 800 MG PO TABS: 800.0000 mg | ORAL_TABLET | Freq: Three times a day (TID) | ORAL | 0 refills | Status: AC | PRN

## 2018-05-16 MED ORDER — ONDANSETRON HCL 4 MG/2ML IJ SOLN
INTRAMUSCULAR | Status: AC
  Filled 2018-05-16: qty 2

## 2018-05-16 MED ORDER — SCOPOLAMINE 1 MG/3DAYS TD PT72: 1.0000 | MEDICATED_PATCH | Freq: Once | TRANSDERMAL | Status: DC | PRN

## 2018-05-16 MED ORDER — OXYCODONE HCL 5 MG PO TABS: 5.0000 mg | ORAL_TABLET | Freq: Four times a day (QID) | ORAL | 0 refills | Status: DC | PRN

## 2018-05-16 MED ORDER — SILVER NITRATE-POT NITRATE 75-25 % EX MISC
CUTANEOUS | Status: AC
  Filled 2018-05-16: qty 1

## 2018-05-16 MED ORDER — FENTANYL CITRATE (PF) 100 MCG/2ML IJ SOLN
50.0000 ug | INTRAMUSCULAR | Status: AC | PRN
  Administered 2018-05-16: 25 ug via INTRAVENOUS
  Administered 2018-05-16: 50 ug via INTRAVENOUS
  Administered 2018-05-16: 25 ug via INTRAVENOUS

## 2018-05-16 MED ORDER — DEXAMETHASONE SODIUM PHOSPHATE 4 MG/ML IJ SOLN
INTRAMUSCULAR | Status: DC | PRN
  Administered 2018-05-16: 10 mg via INTRAVENOUS

## 2018-05-16 MED ORDER — OXYCODONE HCL 5 MG PO TABS: 5.0000 mg | ORAL_TABLET | Freq: Once | ORAL | Status: DC | PRN

## 2018-05-16 MED ORDER — BUPIVACAINE HCL (PF) 0.25 % IJ SOLN
INTRAMUSCULAR | Status: AC
  Filled 2018-05-16: qty 30

## 2018-05-16 MED ORDER — PROMETHAZINE HCL 25 MG/ML IJ SOLN: 6.2500 mg | INTRAMUSCULAR | Status: DC | PRN

## 2018-05-16 MED ORDER — FENTANYL CITRATE (PF) 100 MCG/2ML IJ SOLN
INTRAMUSCULAR | Status: AC
  Filled 2018-05-16: qty 2

## 2018-05-16 SURGICAL SUPPLY — 16 items
CANISTER SUCT 3000ML PPV (MISCELLANEOUS) ×3 IMPLANT
CATH ROBINSON RED A/P 16FR (CATHETERS) ×1 IMPLANT
DEVICE MYOSURE LITE (MISCELLANEOUS) ×2 IMPLANT
DEVICE MYOSURE REACH (MISCELLANEOUS) IMPLANT
FILTER ARTHROSCOPY CONVERTOR (FILTER) ×1 IMPLANT
GLOVE BIOGEL M 6.5 STRL (GLOVE) ×3 IMPLANT
GLOVE BIOGEL PI IND STRL 6.5 (GLOVE) ×1 IMPLANT
GLOVE BIOGEL PI IND STRL 7.0 (GLOVE) ×1 IMPLANT
GLOVE BIOGEL PI INDICATOR 6.5 (GLOVE) ×2
GLOVE BIOGEL PI INDICATOR 7.0 (GLOVE) ×2
GOWN STRL REUS W/TWL LRG LVL3 (GOWN DISPOSABLE) ×6 IMPLANT
KIT PROCEDURE FLUENT (KITS) ×3 IMPLANT
PACK VAGINAL MINOR WOMEN LF (CUSTOM PROCEDURE TRAY) ×3 IMPLANT
PAD OB MATERNITY 4.3X12.25 (PERSONAL CARE ITEMS) ×3 IMPLANT
SEAL ROD LENS SCOPE MYOSURE (ABLATOR) ×3 IMPLANT
TOWEL GREEN STERILE FF (TOWEL DISPOSABLE) ×4 IMPLANT

## 2018-05-16 NOTE — Op Note (Signed)
05/16/2018  1:13 PM  PATIENT:  Mary Dunn  48 y.o. female  PRE-OPERATIVE DIAGNOSIS:  N93.9 abnormal uterine bleeding N94.89 endometrial mass  POST-OPERATIVE DIAGNOSIS:  N93.9 abnormal uterine bleeding N94.89 endometrial mass  PROCEDURE:  Procedure(s): DILATATION AND CURETTAGE /HYSTEROSCOPY WITH MYOSURE (N/A)  SURGEON:  Surgeon(s) and Role:    Christophe Louis, MD - Primary  PHYSICIAN ASSISTANT: None  ASSISTANTS: none   ANESTHESIA:   general  EBL:  10 mL   BLOOD ADMINISTERED:none  DRAINS: none   LOCAL MEDICATIONS USED:  MARCAINE     SPECIMEN:  Source of Specimen:  endometrial curretting and endometrial polyp   DISPOSITION OF SPECIMEN:  PATHOLOGY  COUNTS:  YES  TOURNIQUET:  * No tourniquets in log *  DICTATION: .Dragon Dictation  PLAN OF CARE: Discharge to home after PACU  PATIENT DISPOSITION:  PACU - hemodynamically stable.   Delay start of Pharmacological VTE agent (>24hrs) due to surgical blood loss or risk of bleeding: not applicable  Finding: Polypoid proliferative endometrium . Normal cervix and external genitalia   Procedure: Patient was taken to the operating room where she was placed under general anesthesia. She was placed in the dorsal lithotomy position. She was prepped and draped in the usual sterile fashion. A speculum was placed into the vaginal vault. The anterior lip of the cervix was grasped with a single-tooth tenaculum. Quarter percent Marcaine was injected at the 4 and 8:00 positions of the cervix. The cervix was then sounded to 8 cm. The cervix was dilated to approximately 6 mm. Mysosure operative  hysteroscope was inserted. The findings noted above. Myosure lite  blade was introduced throught the hysteroscope.  Endometrial currettings were obtained. there was no evidence of perforation. Hysteroscope was then removed.  The single-tooth tenaculum was removed from the anterior lip of the cervix. Patient was noted to have bleeding from the tenaculum  site. Silver nitrate was applied and excellent hemostasis was noted. The speculum was removed from the patient's vagina. She was awakened from anesthesia taken care  To the recovery  room awake and in stable condition. Sponge lap and needle counts were correct x2.   Saline deficit 300 mL

## 2018-05-16 NOTE — Discharge Instructions (Signed)

## 2018-05-16 NOTE — Anesthesia Postprocedure Evaluation (Signed)
Anesthesia Post Note  Patient: Mary Dunn  Procedure(s) Performed: DILATATION AND CURETTAGE /HYSTEROSCOPY WITH MYOSURE (N/A Uterus)     Patient location during evaluation: PACU Anesthesia Type: General Level of consciousness: awake and alert Pain management: pain level controlled Vital Signs Assessment: post-procedure vital signs reviewed and stable Respiratory status: spontaneous breathing, nonlabored ventilation and respiratory function stable Cardiovascular status: blood pressure returned to baseline and stable Postop Assessment: no apparent nausea or vomiting Anesthetic complications: no    Last Vitals:  Vitals:   05/16/18 1330 05/16/18 1345  BP: 133/78 (!) 143/74  Pulse: 74 73  Resp: 11 11  Temp:    SpO2: 100% 100%    Last Pain:  Vitals:   05/16/18 1356  TempSrc:   PainSc: 0-No pain                 Brennan Bailey

## 2018-05-16 NOTE — Transfer of Care (Signed)
Immediate Anesthesia Transfer of Care Note  Patient: Mary Dunn  Procedure(s) Performed: DILATATION AND CURETTAGE /HYSTEROSCOPY WITH MYOSURE (N/A Uterus)  Patient Location: PACU  Anesthesia Type:General  Level of Consciousness: sedated  Airway & Oxygen Therapy: Patient Spontanous Breathing and Patient connected to face mask oxygen  Post-op Assessment: Report given to RN and Post -op Vital signs reviewed and stable  Post vital signs: Reviewed and stable  Last Vitals:  Vitals Value Taken Time  BP 125/70 05/16/2018  1:16 PM  Temp    Pulse 75 05/16/2018  1:21 PM  Resp 13 05/16/2018  1:21 PM  SpO2 100 % 05/16/2018  1:21 PM  Vitals shown include unvalidated device data.  Last Pain:  Vitals:   05/16/18 1116  TempSrc: Oral  PainSc: 0-No pain         Complications: No apparent anesthesia complications

## 2018-05-16 NOTE — Anesthesia Preprocedure Evaluation (Addendum)
Anesthesia Evaluation  Patient identified by MRN, date of birth, ID band Patient awake    Reviewed: Allergy & Precautions, NPO status , Patient's Chart, lab work & pertinent test results  History of Anesthesia Complications Negative for: history of anesthetic complications  Airway Mallampati: II  TM Distance: >3 FB Neck ROM: Full    Dental  (+) Teeth Intact, Dental Advisory Given   Pulmonary neg pulmonary ROS,    Pulmonary exam normal breath sounds clear to auscultation       Cardiovascular negative cardio ROS Normal cardiovascular exam Rhythm:Regular Rate:Normal     Neuro/Psych negative neurological ROS     GI/Hepatic Neg liver ROS, GERD  Medicated and Controlled,  Endo/Other  Hypothyroidism Addison's disease  Renal/GU negative Renal ROS     Musculoskeletal negative musculoskeletal ROS (+)   Abdominal   Peds  Hematology negative hematology ROS (+)   Anesthesia Other Findings Day of surgery medications reviewed with the patient.  Reproductive/Obstetrics Endometrial mass                            Anesthesia Physical Anesthesia Plan  ASA: II  Anesthesia Plan: General   Post-op Pain Management:    Induction: Intravenous  PONV Risk Score and Plan: 3 and Treatment may vary due to age or medical condition, Ondansetron, Dexamethasone and Midazolam  Airway Management Planned: LMA  Additional Equipment:   Intra-op Plan:   Post-operative Plan: Extubation in OR  Informed Consent: I have reviewed the patients History and Physical, chart, labs and discussed the procedure including the risks, benefits and alternatives for the proposed anesthesia with the patient or authorized representative who has indicated his/her understanding and acceptance.     Dental advisory given  Plan Discussed with: CRNA  Anesthesia Plan Comments:        Anesthesia Quick Evaluation

## 2018-05-16 NOTE — Anesthesia Procedure Notes (Signed)
Procedure Name: LMA Insertion Date/Time: 05/16/2018 12:37 PM Performed by: Maryella Shivers, CRNA Pre-anesthesia Checklist: Patient identified, Emergency Drugs available, Suction available and Patient being monitored Patient Re-evaluated:Patient Re-evaluated prior to induction Oxygen Delivery Method: Circle system utilized Preoxygenation: Pre-oxygenation with 100% oxygen Induction Type: IV induction Ventilation: Mask ventilation without difficulty LMA: LMA inserted LMA Size: 4.0 Number of attempts: 1 Airway Equipment and Method: Bite block Placement Confirmation: positive ETCO2 Tube secured with: Tape Dental Injury: Teeth and Oropharynx as per pre-operative assessment

## 2018-05-16 NOTE — H&P (Signed)
Date of Initial H&P: 05/16/2018  History reviewed, patient examined, no change in status, stable for surgery.

## 2018-05-16 NOTE — H&P (Signed)
Reason for Appointment  1. Aub   History of Present Illness  General:  48 y/o presents to discuss u/s results to evaluate abnormal uterine bleeding She was last seen in office on 04/11/2018 at which time she was complaining of bright red bleeding which occurred on 04/03/2018 and 04/05/2018. On the 6th she noticed bleeding after exercising. On the 8th she experienced bleeding after sexual intercourse. Her LMP was 03/17/2018.  She had an u/s today which revealed her uterus measuring at 7.9 x 5.1 x 4.6cm. Her endometrium was thickened at 1.46cm with questionable hypocholeric mass at 1.4cm, no blood flow was noted. Right ovary revealed a simple follicle. Left ovary was normal.  Discussed with patient further treatment options being a hysteroscopy D&C.    Current Medications  Taking   Calcium 1500 MG Tablet 1 tablet Orally Twice a day   Fludrocortisone Acetate .05 MG Tablet 1 tablet Orally Once a day   Hydrocortisone 20 MG Tablet 1 tablet Orally Once a day   Levothyroxine Sodium 75 MCG Tablet 1 tablet Orally once a day   Multivitamins Tablet 1 tablet Orally once a day   nexium 1 tab Oral   Medication List reviewed and reconciled with the patient    Past Medical History  Addison's disease.   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   Hypothyroidism.    Surgical History  cesarean section x 1    Family History  Father: deceased, hypertension, thyroid disease  Mother: deceased 52 yrs, hypertension, cancer, breast dx at age 54, diagnosed with Breast cancer  Maternal Grand Mother: cancer, ovarian, dx at age 75 or 1  Sister 1: alive, thyroid disease  1 sister(s) . 2 son(s) , 1 daughter(s) .    Social History  General:  no EXPOSURE TO PASSIVE SMOKE. Alcohol: yes, 1 per week. Children: Boys, 2, girls, 1. Caffeine: yes, coffee, soda, very little, tea, occasionally. Seat belt use: yes. Tobacco use cigarettes: Never smoked, Tobacco history last updated  04/25/2018. Marital Status: married. no Recreational drug use. OCCUPATION: employed, [employer]. Exercise: yes, 3 times weekly.    Gyn History  Sexual activity currently sexually active.  Periods : irregular.  LMP 03/17/2018.  Birth control vasectomy.  Last pap smear date 09/01/2015-neg.  Last mammogram date 06/24/17-normal .  Denies H/O Abnormal pap smear none.  Denies H/O STD.  Menarche 25.  GYN procedures Colposcopy 06/30/09.  Denies H/O Genetic history.  Denies H/O Trying to get pregnant.    OB History  Number of pregnancies 4, 1 SAB.  Pregnancy # 1 live birth, boy, C-section delivery.  Pregnancy # 2 live birth, boy, vaginal .  Pregnancy # 3 live birth, girl, vaginal delivery,.    Allergies  N.K.D.A.   Hospitalization/Major Diagnostic Procedure  childbirth x 3    Review of Systems  CONSTITUTIONAL:  Chills No. Fatigue No. Fever No. Night sweats No. Recent travel outside [place] No. Sweats No. Weight change No.  OPHTHALMOLOGY:  Blurring of vision no. Change in vision no. Double vision no.  ENT:  Dizziness no. Nose bleeds no. Sore throat no. Teeth pain no.  ALLERGY:  Hives no.  CARDIOLOGY:  Chest pain no. High blood pressure no. Irregular heart beat no. Leg edema no. Palpitations no.  RESPIRATORY:  Shortness of breath no. Cough no. Wheezing no.  UROLOGY:  Pain with urination no. Urinary urgency no. Urinary frequency no. Urinary incontinence no. Difficulty urinating No. Blood in urine No.  GASTROENTEROLOGY:  Abdominal pain  no. Appetite change no. Bloating/belching no. Blood in stool or on toilet paper no. Change in bowel movements no. Constipation no. Diarrhea no. Difficulty swallowing no. Nausea no.  FEMALE REPRODUCTIVE:  Vulvar pain no. Vulvar rash no. Abnormal vaginal bleeding yes. Breast pain no. Nipple discharge no. Pain with intercourse no. Pelvic pain no. Unusual vaginal discharge no. Vaginal itching no.  MUSCULOSKELETAL:  Muscle aches no.  NEUROLOGY:  Headache  no. Tingling/numbness no. Weakness no.  PSYCHOLOGY:  Depression no. Anxiety no. Nervousness no. Sleep disturbances no. Suicidal ideation no .  ENDOCRINOLOGY:  Excessive thirst no. Excessive urination no. Hair loss no. Heat or cold intolerance no.  HEMATOLOGY/LYMPH:  Abnormal bleeding no. Easy bruising no. Swollen glands no.  DERMATOLOGY:  New/changing skin lesion no. Rash no. Sores no.       Vital Signs  Wt 126, Wt change 1 lb, Ht 64.25, BMI 21.46, Pulse sitting 78, BP sitting 142/70.   Examination  General Examination: CONSTITUTIONAL: alert, oriented, NAD. SKIN: moist, warm. EYES: Conjunctiva clear. LUNGS: clear to auscultation bilaterally. HEART: regular rate and rhythm. ABDOMEN: no guarding. EXTREMITIES: normal range of motion. NEUROLOGIC EXAM: alert and oriented x 3. PSYCH: affect normal.     Physical Examination  Pt aware of scribe services today.   Assessments   1. Abnormal uterine bleeding - N93.9 (Primary)   2. Pregnancy test negative - Z32.02   3. Endometrial mass - N94.89   Treatment  1. Abnormal uterine bleeding  IMAGING: US ECHO TRANSVAGINAL Notes: most likely secondary to endometrial polyp.. d/w pt hysteroscopy D&C and polypectomy r/b/a discussed including but not limited to infection bleeding, uterine perforation with the need for futher surgery. pt voiced understanding and wishes to proceed plan surgical clearance from Endocrinologist Dr. Roque Cash with [facility].  Referral To: Reason:Please check insurance coverage of hysteroscopy D&C and polypectomy     2. Pregnancy test negative  LAB: HCG, Urine (pregnancy)   3. Endometrial mass  Notes: Discussed with pt. proceeding with a hysteroscopy D&C. Discussed risk of surgery with patient including but not limited to infection/bleeding, uterine perforation with the need for further surgery. Pt. wishes to proceed with surgery. Pt. is aware that she will be out of work for day of surgery and day  afterwards. No sexual activity for two weeks. Will send a surgical clearance to Dr. Roque Cash due to her history of Addison's disease before proceeding with procedure. Pt. aware no eating or drinking night prior to surgery. Pt. aware to avoid taking Aleve, Ibuprofen, Motrin. She can take Tylenol from now up until surgery. Marland Kitchen

## 2018-05-17 ENCOUNTER — Encounter (HOSPITAL_BASED_OUTPATIENT_CLINIC_OR_DEPARTMENT_OTHER): Payer: Self-pay | Admitting: Obstetrics and Gynecology

## 2018-05-17 DIAGNOSIS — E310 Autoimmune polyglandular failure: Secondary | ICD-10-CM | POA: Diagnosis not present

## 2018-05-17 DIAGNOSIS — Z0000 Encounter for general adult medical examination without abnormal findings: Secondary | ICD-10-CM | POA: Diagnosis not present

## 2018-05-17 DIAGNOSIS — E271 Primary adrenocortical insufficiency: Secondary | ICD-10-CM | POA: Diagnosis not present

## 2018-05-17 DIAGNOSIS — E038 Other specified hypothyroidism: Secondary | ICD-10-CM | POA: Diagnosis not present

## 2018-06-06 ENCOUNTER — Other Ambulatory Visit: Payer: Self-pay | Admitting: Obstetrics and Gynecology

## 2018-06-06 DIAGNOSIS — Z1231 Encounter for screening mammogram for malignant neoplasm of breast: Secondary | ICD-10-CM

## 2018-06-30 ENCOUNTER — Ambulatory Visit: Payer: 59

## 2018-09-08 ENCOUNTER — Other Ambulatory Visit: Payer: Self-pay

## 2018-09-08 ENCOUNTER — Ambulatory Visit
Admission: RE | Admit: 2018-09-08 | Discharge: 2018-09-08 | Disposition: A | Discharge status: Home / Self Care | Payer: 59 | Source: Ambulatory Visit | Attending: Obstetrics and Gynecology | Admitting: Obstetrics and Gynecology

## 2018-09-08 DIAGNOSIS — Z1231 Encounter for screening mammogram for malignant neoplasm of breast: Secondary | ICD-10-CM

## 2018-12-06 ENCOUNTER — Encounter: Payer: Self-pay | Admitting: Internal Medicine

## 2018-12-29 ENCOUNTER — Other Ambulatory Visit: Payer: Self-pay | Admitting: *Deleted

## 2019-01-15 ENCOUNTER — Telehealth: Payer: Self-pay | Admitting: *Deleted

## 2019-01-15 ENCOUNTER — Ambulatory Visit (AMBULATORY_SURGERY_CENTER): Payer: Self-pay | Admitting: *Deleted

## 2019-01-15 ENCOUNTER — Other Ambulatory Visit: Payer: Self-pay

## 2019-01-15 VITALS — Temp 97.5°F | Ht 65.0 in | Wt 126.8 lb

## 2019-01-15 DIAGNOSIS — Z1211 Encounter for screening for malignant neoplasm of colon: Secondary | ICD-10-CM

## 2019-01-15 MED ORDER — SUPREP BOWEL PREP KIT 17.5-3.13-1.6 GM/177ML PO SOLN: 1.0000 | Freq: Once | ORAL | 0 refills | Status: AC

## 2019-01-15 NOTE — Telephone Encounter (Signed)
Nothing, but thanks for checking

## 2019-01-15 NOTE — Progress Notes (Signed)
No egg or soy allergy known to patient  No issues with past sedation with any surgeries  or procedures, no intubation problems  No diet pills per patient No home 02 use per patient  No blood thinners per patient  Pt denies issues with constipation  No A fib or A flutter  EMMI video sent to pt's e mail   Due to the COVID-19 pandemic we are asking patients to follow these guidelines. Please only bring one care partner. Please be aware that your care partner may wait in the car in the parking lot or if they feel like they will be too hot to wait in the car, they may wait in the lobby on the 4th floor. All care partners are required to wear a mask the entire time (we do not have any that we can provide them), they need to practice social distancing, and we will do a Covid check for all patient's and care partners when you arrive. Also we will check their temperature and your temperature. If the care partner waits in their car they need to stay in the parking lot the entire time and we will call them on their cell phone when the patient is ready for discharge so they can bring the car to the front of the building. Also all patient's will need to wear a mask into building.  Suprep $15 coupon  Pt declined COVID screen for 10/30 colon   Pt has Addison's dx and will contact her MD about possibly increasing Steroids for her colonoscopy-

## 2019-01-15 NOTE — Telephone Encounter (Signed)
Dr Henrene Pastor,  I saw this pt today in [facility]- she has a diagnosis of Addison's Disease- She told me she will contact her MD about possible increasing her steroids for her colonoscopy but is there any thing on our end we need to do or provide for her procedure ?  Please advise, Thanks, Marijean Niemann

## 2019-01-16 ENCOUNTER — Encounter: Payer: Self-pay | Admitting: Internal Medicine

## 2019-01-19 ENCOUNTER — Other Ambulatory Visit (HOSPITAL_COMMUNITY)
Admission: RE | Admit: 2019-01-19 | Discharge: 2019-01-19 | Disposition: A | Discharge status: Home / Self Care | Payer: 59 | Source: Ambulatory Visit | Attending: Obstetrics and Gynecology | Admitting: Obstetrics and Gynecology

## 2019-01-19 DIAGNOSIS — Z01419 Encounter for gynecological examination (general) (routine) without abnormal findings: Secondary | ICD-10-CM | POA: Insufficient documentation

## 2019-01-23 LAB — CYTOLOGY - PAP
Comment: NEGATIVE
Diagnosis: NEGATIVE
High risk HPV: NEGATIVE

## 2019-01-26 ENCOUNTER — Other Ambulatory Visit: Payer: Self-pay

## 2019-01-26 ENCOUNTER — Encounter: Payer: Self-pay | Admitting: Internal Medicine

## 2019-01-26 ENCOUNTER — Ambulatory Visit (AMBULATORY_SURGERY_CENTER): Payer: 59 | Admitting: Internal Medicine

## 2019-01-26 ENCOUNTER — Other Ambulatory Visit: Payer: Self-pay | Admitting: Internal Medicine

## 2019-01-26 VITALS — BP 94/74 | HR 72 | Temp 98.7°F | Resp 9 | Ht 65.0 in | Wt 126.0 lb

## 2019-01-26 DIAGNOSIS — Z1211 Encounter for screening for malignant neoplasm of colon: Secondary | ICD-10-CM

## 2019-01-26 DIAGNOSIS — D124 Benign neoplasm of descending colon: Secondary | ICD-10-CM | POA: Diagnosis not present

## 2019-01-26 MED ORDER — SODIUM CHLORIDE 0.9 % IV SOLN: 500.0000 mL | Freq: Once | INTRAVENOUS | Status: DC

## 2019-01-26 NOTE — Progress Notes (Signed)
Called to room to assist during endoscopic procedure.  Patient ID and intended procedure confirmed with present staff. Received instructions for my participation in the procedure from the performing physician.  

## 2019-01-26 NOTE — Progress Notes (Signed)
A and O x3. Report to RN. Tolerated MAC anesthesia well.

## 2019-01-26 NOTE — Op Note (Signed)
[facility] Patient Name: Mary Dunn Procedure Date: 01/26/2019 1:16 PM MRN: [id number] Endoscopist: Docia Chuck. Henrene Pastor , MD Age: 48 Referring MD:  Date of Birth: 1970/07/03 Gender: Female Account #: [id number] Procedure:                Colonoscopy with cold snaare polypectomy x 1 Indications:              Screening for colorectal malignant neoplasm Medicines:                Monitored Anesthesia Care Procedure:                Pre-Anesthesia Assessment:                           - Prior to the procedure, a History and Physical                            was performed, and patient medications and                            allergies were reviewed. The patient's tolerance of                            previous anesthesia was also reviewed. The risks                            and benefits of the procedure and the sedation                            options and risks were discussed with the patient.                            All questions were answered, and informed consent                            was obtained. Prior Anticoagulants: The patient has                            taken no previous anticoagulant or antiplatelet                            agents. ASA Grade Assessment: II - A patient with                            mild systemic disease. After reviewing the risks                            and benefits, the patient was deemed in                            satisfactory condition to undergo the procedure.                           After obtaining informed consent, the colonoscope  was passed under direct vision. Throughout the                            procedure, the patient's blood pressure, pulse, and                            oxygen saturations were monitored continuously. The                            Colonoscope was introduced through the anus and                            advanced to the the cecum, identified by         appendiceal orifice and ileocecal valve. The                            ileocecal valve, appendiceal orifice, and rectum                            were photographed. The quality of the bowel                            preparation was excellent. The colonoscopy was                            performed without difficulty. The patient tolerated                            the procedure well. The bowel preparation used was                            SUPREP via split dose instruction. Scope In: 1:32:26 PM Scope Out: 1:47:05 PM Scope Withdrawal Time: 0 hours 10 minutes 50 seconds  Total Procedure Duration: 0 hours 14 minutes 39 seconds  Findings:                 A 5 mm polyp was found in the descending colon. The                            polyp was removed with a cold snare. Resection and                            retrieval were complete.                           The exam was otherwise without abnormality on                            direct and retroflexion views. Complications:            No immediate complications. Estimated blood loss:                            None. Estimated Blood Loss:     Estimated blood loss: none. Impression:               -  One 5 mm polyp in the descending colon, removed                            with a cold snare. Resected and retrieved.                           - The examination was otherwise normal on direct                            and retroflexion views. Recommendation:           - Repeat colonoscopy in 7-10 years for surveillance.                           - Patient has a contact number available for                            emergencies. The signs and symptoms of potential                            delayed complications were discussed with the                            patient. Return to normal activities tomorrow.                            Written discharge instructions were provided to the                            patient.                            - Resume previous diet.                           - Continue present medications.                           - Await pathology results. Docia Chuck. Henrene Pastor, MD 01/26/2019 1:58:44 PM This report has been signed electronically.

## 2019-01-26 NOTE — Progress Notes (Signed)
Pt's states no medical or surgical changes since previsit or office visit. VS done by CW. Temp done by JB

## 2019-01-26 NOTE — Patient Instructions (Signed)
Handout given for polyps.  YOU HAD AN ENDOSCOPIC PROCEDURE TODAY AT THE [facility]:   Refer to the procedure report that was given to you for any specific questions about what was found during the examination.  If the procedure report does not answer your questions, please call your gastroenterologist to clarify.  If you requested that your care partner not be given the details of your procedure findings, then the procedure report has been included in a sealed envelope for you to review at your convenience later.  YOU SHOULD EXPECT: Some feelings of bloating in the abdomen. Passage of more gas than usual.  Walking can help get rid of the air that was put into your GI tract during the procedure and reduce the bloating. If you had a lower endoscopy (such as a colonoscopy or flexible sigmoidoscopy) you may notice spotting of blood in your stool or on the toilet paper. If you underwent a bowel prep for your procedure, you may not have a normal bowel movement for a few days.  Please Note:  You might notice some irritation and congestion in your nose or some drainage.  This is from the oxygen used during your procedure.  There is no need for concern and it should clear up in a day or so.  SYMPTOMS TO REPORT IMMEDIATELY:   Following lower endoscopy (colonoscopy or flexible sigmoidoscopy):  Excessive amounts of blood in the stool  Significant tenderness or worsening of abdominal pains  Swelling of the abdomen that is new, acute  Fever of 100F or higher  For urgent or emergent issues, a gastroenterologist can be reached at any hour by calling [phone].   DIET:  We do recommend a small meal at first, but then you may proceed to your regular diet.  Drink plenty of fluids but you should avoid alcoholic beverages for 24 hours.  ACTIVITY:  You should plan to take it easy for the rest of today and you should NOT DRIVE or use heavy machinery until tomorrow (because of the sedation  medicines used during the test).    FOLLOW UP: Our staff will call the number listed on your records 48-72 hours following your procedure to check on you and address any questions or concerns that you may have regarding the information given to you following your procedure. If we do not reach you, we will leave a message.  We will attempt to reach you two times.  During this call, we will ask if you have developed any symptoms of COVID 19. If you develop any symptoms (ie: fever, flu-like symptoms, shortness of breath, cough etc.) before then, please call [phone].  If you test positive for Covid 19 in the 2 weeks post procedure, please call and report this information to us.    If any biopsies were taken you will be contacted by phone or by letter within the next 1-3 weeks.  Please call us at [phone] if you have not heard about the biopsies in 3 weeks.    SIGNATURES/CONFIDENTIALITY: You and/or your care partner have signed paperwork which will be entered into your electronic medical record.  These signatures attest to the fact that that the information above on your After Visit Summary has been reviewed and is understood.  Full responsibility of the confidentiality of this discharge information lies with you and/or your care-partner. 

## 2019-01-30 ENCOUNTER — Telehealth: Payer: Self-pay

## 2019-01-30 ENCOUNTER — Telehealth: Payer: Self-pay | Admitting: *Deleted

## 2019-01-30 NOTE — Telephone Encounter (Signed)
  Follow up Call-  Call back number 01/26/2019  Post procedure Call Back phone  # [phone]  Permission to leave phone message Yes  Some recent data might be hidden     Patient questions:  Do you have a fever, pain , or abdominal swelling? No. Pain Score  0 *  Have you tolerated food without any problems? Yes.    Have you been able to return to your normal activities? Yes.    Do you have any questions about your discharge instructions: Diet   No. Medications  No. Follow up visit  No.  Do you have questions or concerns about your Care? No.  Actions: * If pain score is 4 or above: No action needed, pain <4.

## 2019-01-30 NOTE — Telephone Encounter (Signed)
No answer, left message to call back later today, B.Raeden Schippers RN. 

## 2019-01-31 ENCOUNTER — Encounter: Payer: Self-pay | Admitting: Internal Medicine

## 2019-05-18 DIAGNOSIS — E041 Nontoxic single thyroid nodule: Secondary | ICD-10-CM | POA: Diagnosis not present

## 2019-05-18 DIAGNOSIS — Z0000 Encounter for general adult medical examination without abnormal findings: Secondary | ICD-10-CM | POA: Diagnosis not present

## 2019-05-18 DIAGNOSIS — E039 Hypothyroidism, unspecified: Secondary | ICD-10-CM | POA: Diagnosis not present

## 2019-05-18 DIAGNOSIS — M859 Disorder of bone density and structure, unspecified: Secondary | ICD-10-CM | POA: Diagnosis not present

## 2019-05-23 ENCOUNTER — Ambulatory Visit (INDEPENDENT_AMBULATORY_CARE_PROVIDER_SITE_OTHER): Payer: 59 | Admitting: Family Medicine

## 2019-05-23 ENCOUNTER — Encounter: Payer: Self-pay | Admitting: Family Medicine

## 2019-05-23 ENCOUNTER — Other Ambulatory Visit: Payer: Self-pay

## 2019-05-23 DIAGNOSIS — M545 Low back pain, unspecified: Secondary | ICD-10-CM

## 2019-05-23 DIAGNOSIS — E271 Primary adrenocortical insufficiency: Secondary | ICD-10-CM | POA: Insufficient documentation

## 2019-05-23 MED ORDER — TRAMADOL HCL 50 MG PO TABS: 50.0000 mg | ORAL_TABLET | Freq: Four times a day (QID) | ORAL | 0 refills | Status: AC | PRN

## 2019-05-23 MED ORDER — MELOXICAM 15 MG PO TABS: 7.5000 mg | ORAL_TABLET | Freq: Every day | ORAL | 6 refills | Status: AC | PRN

## 2019-05-23 MED ORDER — TIZANIDINE HCL 2 MG PO TABS: 2.0000 mg | ORAL_TABLET | Freq: Four times a day (QID) | ORAL | 1 refills | Status: AC | PRN

## 2019-05-23 NOTE — Patient Instructions (Signed)
 "  [facility] Protocol for lumbar disc   

## 2019-05-23 NOTE — Progress Notes (Signed)
Office Visit Note   Patient: Mary Dunn           Date of Birth: 10-05-1970           MRN: [id number] Visit Date: 05/23/2019 Requested by: Reynold Bowen, MD [place],  [place] [id number] PCP: Reynold Bowen, MD  Subjective: Chief Complaint  Patient presents with  . Lower Back - Pain    Pain across lower back and into right lateral hip x 2 days. NKI. She had just finished a workout - was fine throughout the workout until she sat down in her car. It was the same type workout as usual.    HPI: She is here with right low back/posterior hip pain.  Symptoms started 2 days ago.  She had just finished a workout and was sitting down into her car when she felt a sudden onset of pain in the right posterior hip.  It has been very painful since then, hurts and almost every position but feels better when standing up.  No previous problems with her lower back but she has had cervical radiculopathy in the past which resolved with epidural steroid injection.  She has a history of Addison's disease since age 49 and is on corticosteroids chronically.  She had a bone density test recently which was normal.  She has never had a stress fracture or compression fracture.               ROS: No fevers or chills, no urinary symptoms, no bowel or bladder dysfunction, no rash.  All other systems were reviewed and are negative.  Objective: Vital Signs: There were no vitals taken for this visit.  Physical Exam:  General:  Alert and oriented, in no acute distress. Pulm:  Breathing unlabored. Psy:  Normal mood, congruent affect. Skin: Normal Low back: She has no tenderness to palpation over her lumbar spinous processes or the SI joints.  She is tender in the right sciatic notch and it seems to reproduce her pain.  No pain over the greater trochanter, no pain with internal hip rotation, straight leg raise is mildly positive.  Lower extremity strength and reflexes are  normal.  Imaging: None today  Assessment & Plan: 1.  Right posterior hip pain, possible disc protrusion -We will try Zanaflex, meloxicam and tramadol as needed. -Home exercises given. -If symptoms persist, possibly physical therapy or referral for epidural steroid injection. -Probably would obtain x-rays prior to injection.     Procedures: No procedures performed  No notes on file     PMFS History: Patient Active Problem List   Diagnosis Date Noted  . Addison's disease ([place]) 05/23/2019   Past Medical History:  Diagnosis Date  . Addison's disease ([place])   . GERD (gastroesophageal reflux disease)   . Hypothyroidism   . Thyroid disease     Family History  Problem Relation Age of Onset  . Breast cancer Mother 84  . Colon cancer Neg Hx   . Colon polyps Neg Hx   . Esophageal cancer Neg Hx   . Rectal cancer Neg Hx   . Stomach cancer Neg Hx     Past Surgical History:  Procedure Laterality Date  . CESAREAN SECTION    . DILATATION & CURETTAGE/HYSTEROSCOPY WITH MYOSURE N/A 05/16/2018   Procedure: DILATATION AND CURETTAGE /HYSTEROSCOPY WITH MYOSURE;  Surgeon: Christophe Louis, MD;  Location: [facility];  Service: Gynecology;  Laterality: N/A;  . WISDOM TOOTH EXTRACTION     Social History  Occupational History  . Not on file  Tobacco Use  . Smoking status: Never Smoker  . Smokeless tobacco: Never Used  Substance and Sexual Activity  . Alcohol use: Yes    Comment: occ  . Drug use: No  . Sexual activity: Not on file

## 2019-05-25 DIAGNOSIS — E271 Primary adrenocortical insufficiency: Secondary | ICD-10-CM | POA: Diagnosis not present

## 2019-05-25 DIAGNOSIS — E310 Autoimmune polyglandular failure: Secondary | ICD-10-CM | POA: Diagnosis not present

## 2019-05-25 DIAGNOSIS — Z1331 Encounter for screening for depression: Secondary | ICD-10-CM | POA: Diagnosis not present

## 2019-05-25 DIAGNOSIS — R82998 Other abnormal findings in urine: Secondary | ICD-10-CM | POA: Diagnosis not present

## 2019-05-25 DIAGNOSIS — E039 Hypothyroidism, unspecified: Secondary | ICD-10-CM | POA: Diagnosis not present

## 2019-05-25 DIAGNOSIS — Z0000 Encounter for general adult medical examination without abnormal findings: Secondary | ICD-10-CM | POA: Diagnosis not present

## 2019-05-25 DIAGNOSIS — D126 Benign neoplasm of colon, unspecified: Secondary | ICD-10-CM | POA: Diagnosis not present

## 2019-06-22 DIAGNOSIS — Z1212 Encounter for screening for malignant neoplasm of rectum: Secondary | ICD-10-CM | POA: Diagnosis not present

## 2019-11-07 ENCOUNTER — Other Ambulatory Visit: Payer: Self-pay | Admitting: Obstetrics and Gynecology

## 2019-11-07 DIAGNOSIS — Z1231 Encounter for screening mammogram for malignant neoplasm of breast: Secondary | ICD-10-CM

## 2019-11-21 ENCOUNTER — Other Ambulatory Visit: Payer: Self-pay

## 2019-11-21 ENCOUNTER — Ambulatory Visit
Admission: RE | Admit: 2019-11-21 | Discharge: 2019-11-21 | Disposition: A | Discharge status: Home / Self Care | Payer: BC Managed Care – PPO | Source: Ambulatory Visit | Attending: Obstetrics and Gynecology | Admitting: Obstetrics and Gynecology

## 2019-11-21 DIAGNOSIS — Z1231 Encounter for screening mammogram for malignant neoplasm of breast: Secondary | ICD-10-CM

## 2019-11-26 DIAGNOSIS — D126 Benign neoplasm of colon, unspecified: Secondary | ICD-10-CM | POA: Diagnosis not present

## 2020-01-11 DIAGNOSIS — F4322 Adjustment disorder with anxiety: Secondary | ICD-10-CM | POA: Diagnosis not present

## 2020-01-18 DIAGNOSIS — F4322 Adjustment disorder with anxiety: Secondary | ICD-10-CM | POA: Diagnosis not present

## 2020-01-25 DIAGNOSIS — F4322 Adjustment disorder with anxiety: Secondary | ICD-10-CM | POA: Diagnosis not present

## 2020-01-31 DIAGNOSIS — Z872 Personal history of diseases of the skin and subcutaneous tissue: Secondary | ICD-10-CM | POA: Diagnosis not present

## 2020-01-31 DIAGNOSIS — L905 Scar conditions and fibrosis of skin: Secondary | ICD-10-CM | POA: Diagnosis not present

## 2020-01-31 DIAGNOSIS — L814 Other melanin hyperpigmentation: Secondary | ICD-10-CM | POA: Diagnosis not present

## 2020-01-31 DIAGNOSIS — D225 Melanocytic nevi of trunk: Secondary | ICD-10-CM | POA: Diagnosis not present

## 2020-02-01 DIAGNOSIS — F4322 Adjustment disorder with anxiety: Secondary | ICD-10-CM | POA: Diagnosis not present

## 2020-02-01 DIAGNOSIS — Z113 Encounter for screening for infections with a predominantly sexual mode of transmission: Secondary | ICD-10-CM | POA: Diagnosis not present

## 2020-02-01 DIAGNOSIS — Z01419 Encounter for gynecological examination (general) (routine) without abnormal findings: Secondary | ICD-10-CM | POA: Diagnosis not present

## 2020-02-15 DIAGNOSIS — F4322 Adjustment disorder with anxiety: Secondary | ICD-10-CM | POA: Diagnosis not present

## 2020-03-04 DIAGNOSIS — Z30430 Encounter for insertion of intrauterine contraceptive device: Secondary | ICD-10-CM | POA: Diagnosis not present

## 2020-03-04 DIAGNOSIS — Z3202 Encounter for pregnancy test, result negative: Secondary | ICD-10-CM | POA: Diagnosis not present

## 2020-03-14 DIAGNOSIS — F4322 Adjustment disorder with anxiety: Secondary | ICD-10-CM | POA: Diagnosis not present

## 2020-04-21 DIAGNOSIS — Z30431 Encounter for routine checking of intrauterine contraceptive device: Secondary | ICD-10-CM | POA: Diagnosis not present

## 2020-04-21 DIAGNOSIS — T8332XA Displacement of intrauterine contraceptive device, initial encounter: Secondary | ICD-10-CM | POA: Diagnosis not present

## 2020-04-21 DIAGNOSIS — N921 Excessive and frequent menstruation with irregular cycle: Secondary | ICD-10-CM | POA: Diagnosis not present

## 2020-04-21 DIAGNOSIS — Z975 Presence of (intrauterine) contraceptive device: Secondary | ICD-10-CM | POA: Diagnosis not present

## 2020-05-20 DIAGNOSIS — E039 Hypothyroidism, unspecified: Secondary | ICD-10-CM | POA: Diagnosis not present

## 2020-05-20 DIAGNOSIS — Z0000 Encounter for general adult medical examination without abnormal findings: Secondary | ICD-10-CM | POA: Diagnosis not present

## 2020-05-20 DIAGNOSIS — R82998 Other abnormal findings in urine: Secondary | ICD-10-CM | POA: Diagnosis not present

## 2020-05-20 DIAGNOSIS — M859 Disorder of bone density and structure, unspecified: Secondary | ICD-10-CM | POA: Diagnosis not present

## 2020-05-27 DIAGNOSIS — E271 Primary adrenocortical insufficiency: Secondary | ICD-10-CM | POA: Diagnosis not present

## 2020-11-06 DIAGNOSIS — L821 Other seborrheic keratosis: Secondary | ICD-10-CM | POA: Diagnosis not present

## 2020-11-06 DIAGNOSIS — D225 Melanocytic nevi of trunk: Secondary | ICD-10-CM | POA: Diagnosis not present

## 2020-11-06 DIAGNOSIS — L538 Other specified erythematous conditions: Secondary | ICD-10-CM | POA: Diagnosis not present

## 2020-11-06 DIAGNOSIS — L578 Other skin changes due to chronic exposure to nonionizing radiation: Secondary | ICD-10-CM | POA: Diagnosis not present

## 2020-11-06 DIAGNOSIS — L820 Inflamed seborrheic keratosis: Secondary | ICD-10-CM | POA: Diagnosis not present

## 2021-02-13 DIAGNOSIS — Z01419 Encounter for gynecological examination (general) (routine) without abnormal findings: Secondary | ICD-10-CM | POA: Diagnosis not present

## 2021-02-13 DIAGNOSIS — Z1231 Encounter for screening mammogram for malignant neoplasm of breast: Secondary | ICD-10-CM | POA: Diagnosis not present

## 2021-02-27 IMAGING — MG DIGITAL SCREENING BILATERAL MAMMOGRAM WITH TOMO AND CAD
8 series · 9 of 24 positions shown · non-contrast
Comparison: Previous exam(s).

CLINICAL DATA: Screening.

EXAM:
DIGITAL SCREENING BILATERAL MAMMOGRAM WITH TOMO AND CAD

[R MLO synth-2D]
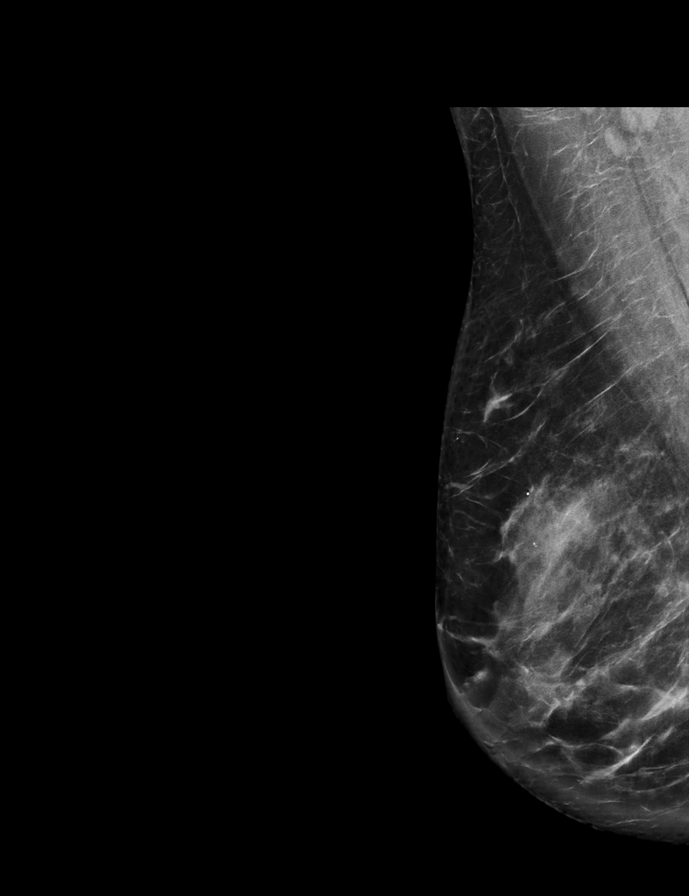

[L CC synth-2D]
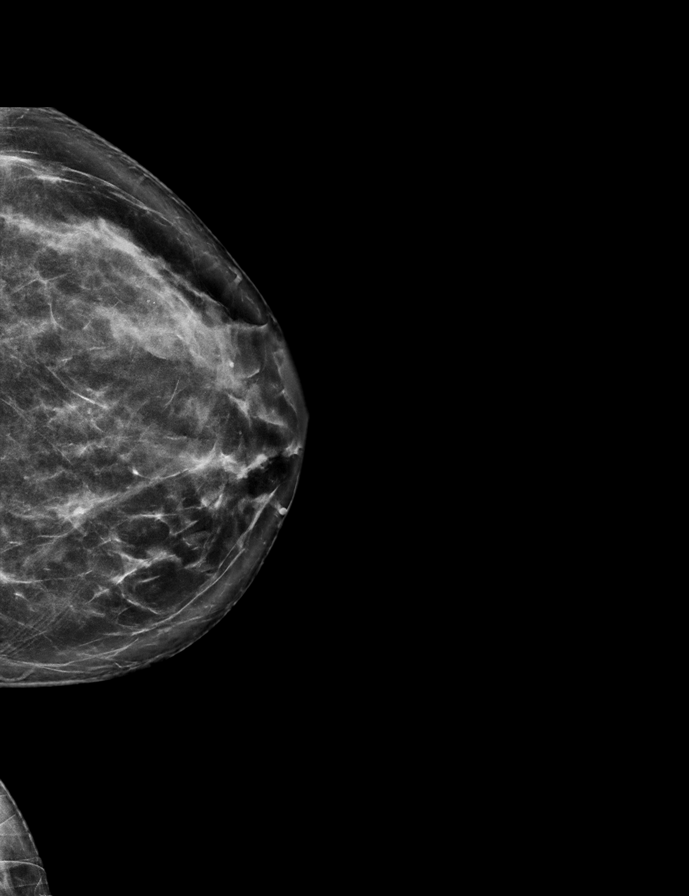

[R CC synth-2D]
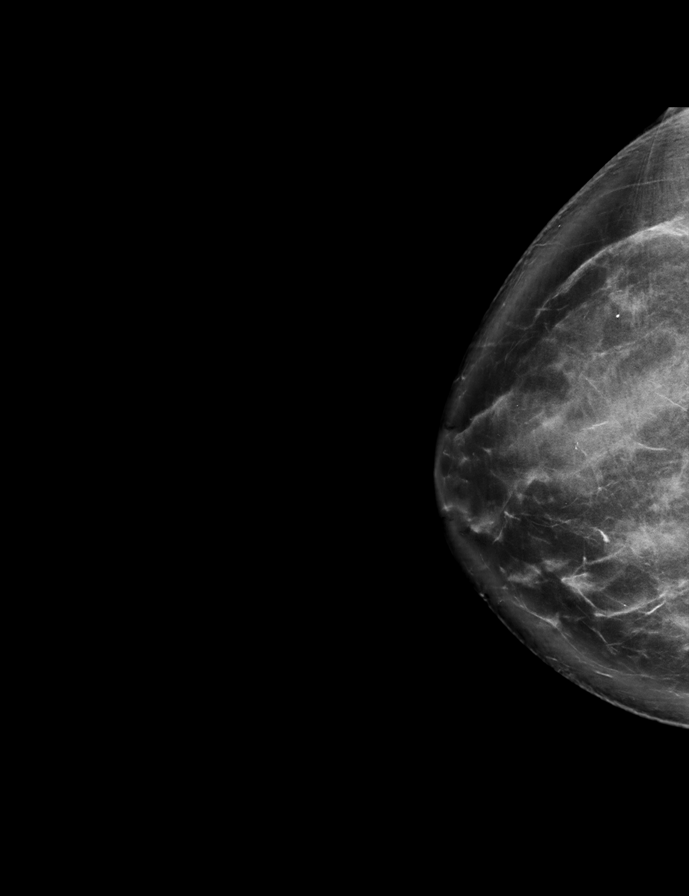

[L MLO synth-2D]
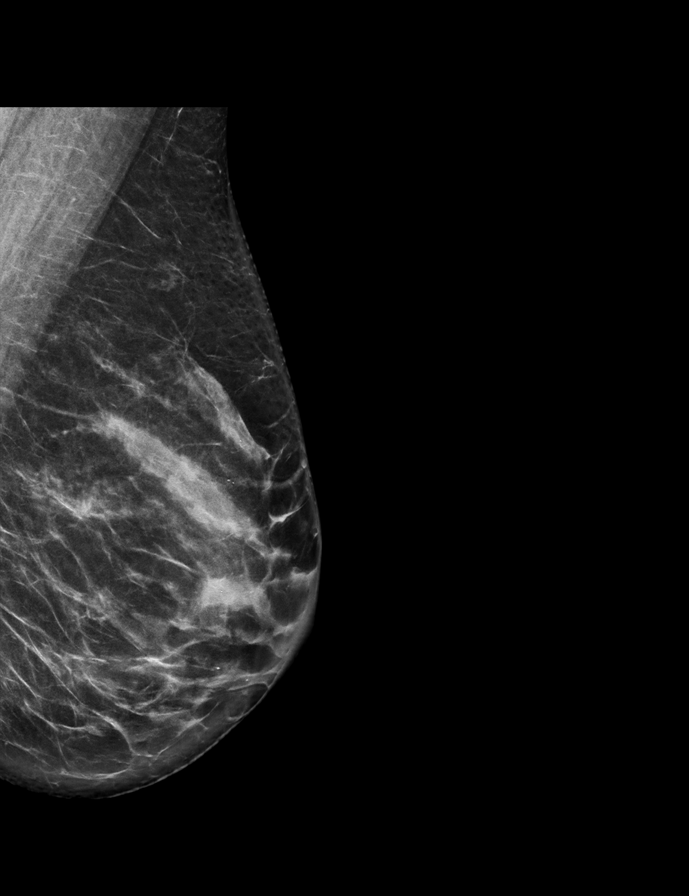

[R MLO tomo · 2 of 79 frames shown]
[frame 26/79]
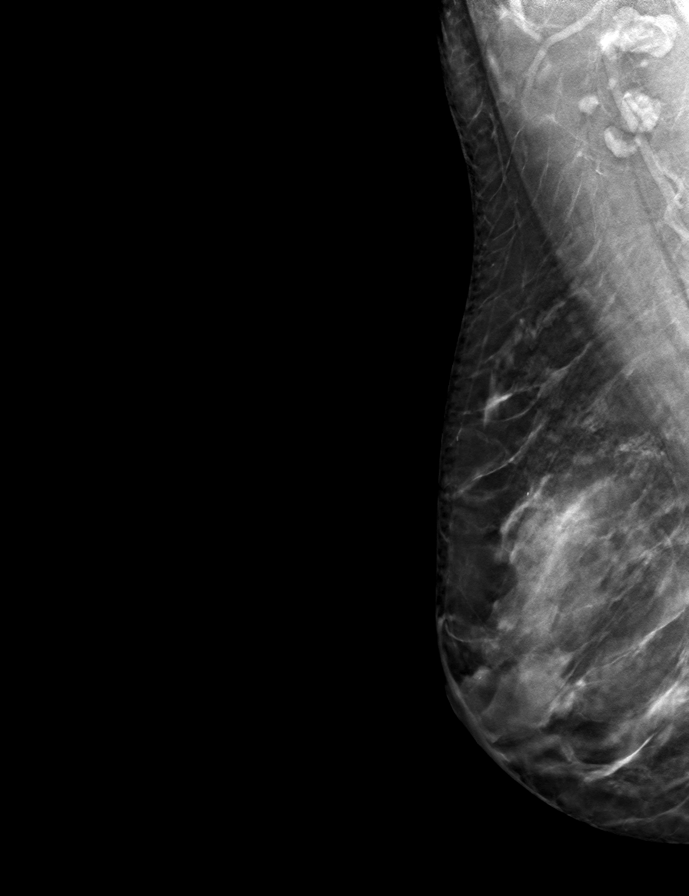
[frame 40/79]
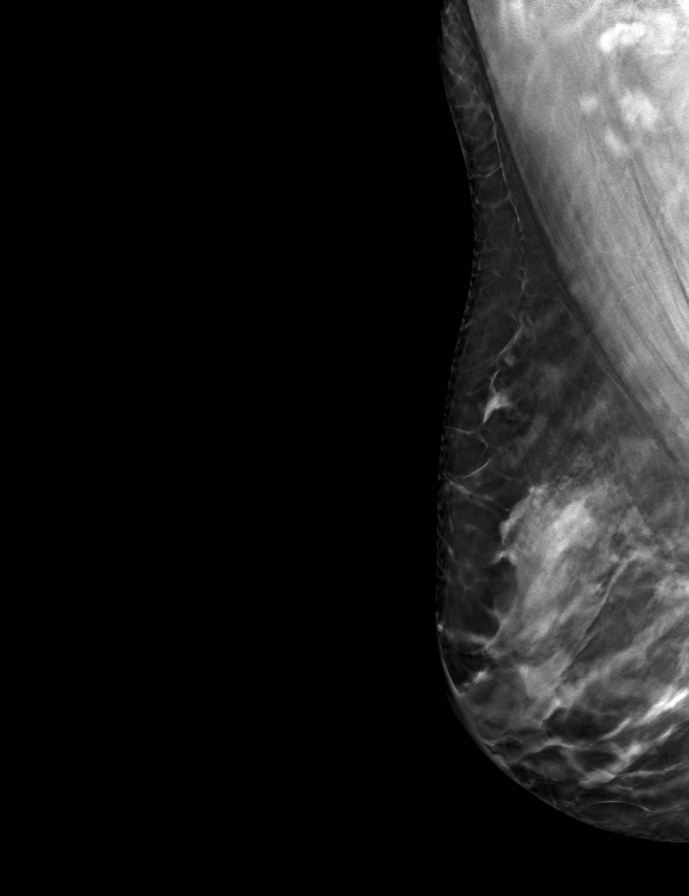

[L CC tomo · tomo slice 38/75.0]
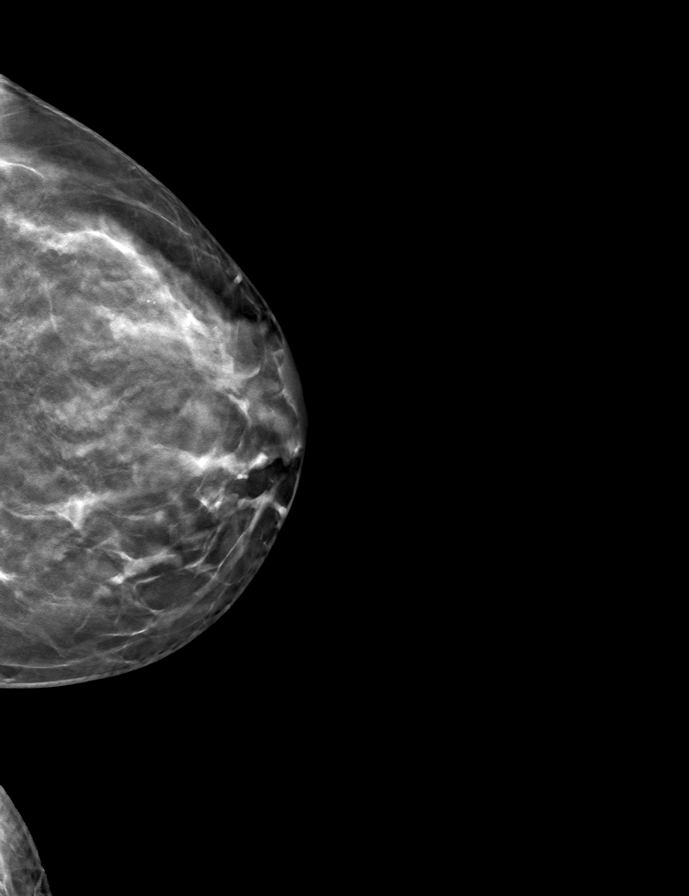

[R CC tomo · tomo slice 43/84.0]
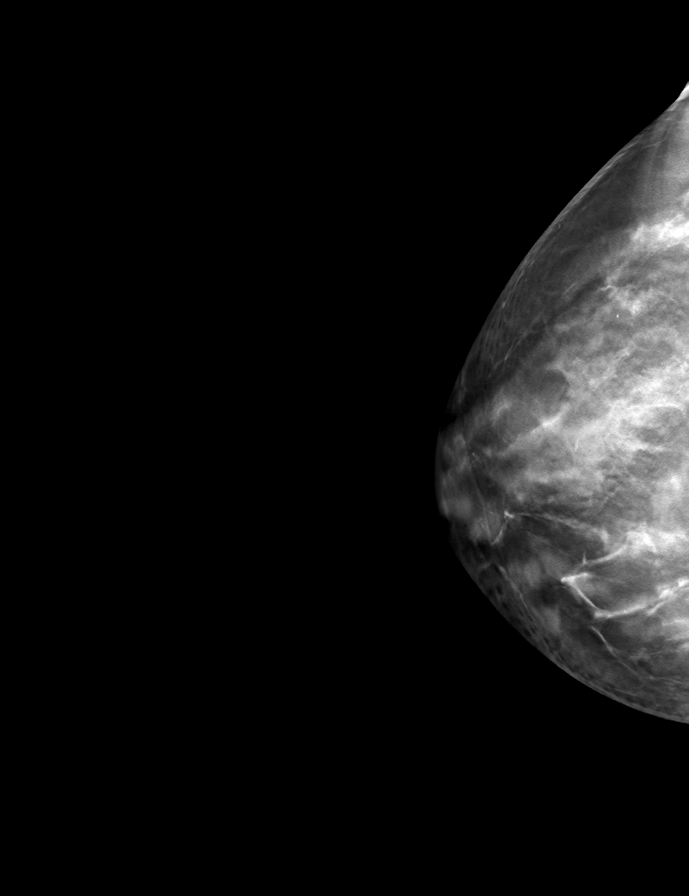

[L MLO tomo · tomo slice 39/76.0]
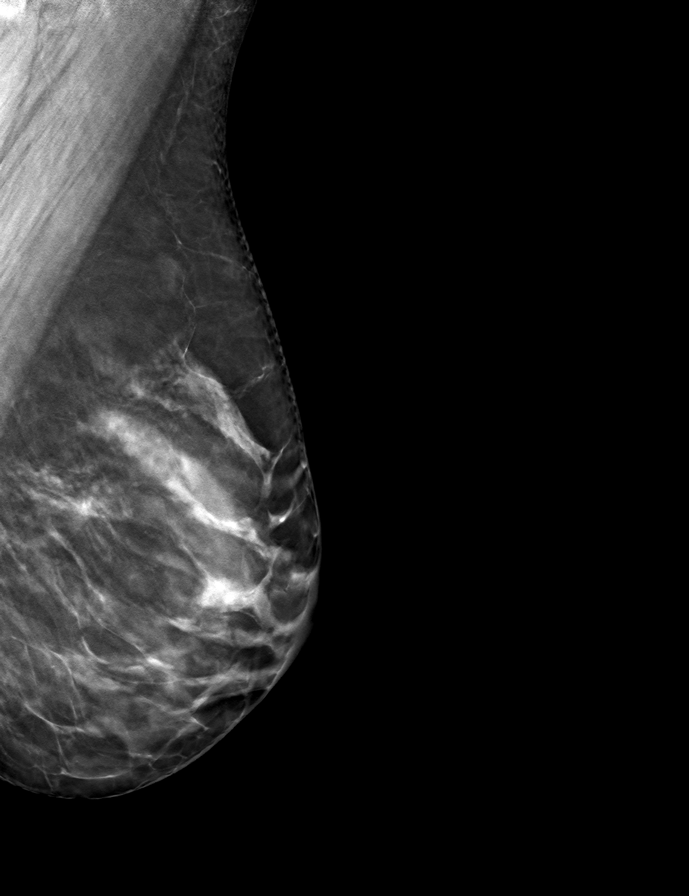

[9 of 24 positions shown; findings below may reference images not displayed]

ACR Breast Density Category c: The breast tissue is heterogeneously
dense, which may obscure small masses.
FINDINGS: There are no findings suspicious for malignancy. Images were
processed with CAD.
IMPRESSION: No mammographic evidence of malignancy. A result letter of this
screening mammogram will be mailed directly to the patient.

RECOMMENDATION:
Screening mammogram in one year. (Code:FT-U-LHB)

BI-RADS CATEGORY  1: Negative.

## 2021-03-06 DIAGNOSIS — R030 Elevated blood-pressure reading, without diagnosis of hypertension: Secondary | ICD-10-CM | POA: Diagnosis not present

## 2021-03-06 DIAGNOSIS — E271 Primary adrenocortical insufficiency: Secondary | ICD-10-CM | POA: Diagnosis not present
